# Patient Record
Sex: Female | Born: 1980 | Hispanic: No | Marital: Single | Smoking: Never smoker
Health system: Southern US, Community
[De-identification: ages and names within clinical notes are randomized; demographics above are authoritative.]

## PROBLEM LIST (undated history)

## (undated) DIAGNOSIS — Z9889 Other specified postprocedural states: Secondary | ICD-10-CM

## (undated) DIAGNOSIS — D689 Coagulation defect, unspecified: Secondary | ICD-10-CM

## (undated) DIAGNOSIS — D219 Benign neoplasm of connective and other soft tissue, unspecified: Secondary | ICD-10-CM

## (undated) DIAGNOSIS — F419 Anxiety disorder, unspecified: Secondary | ICD-10-CM

## (undated) DIAGNOSIS — K5090 Crohn's disease, unspecified, without complications: Secondary | ICD-10-CM

## (undated) DIAGNOSIS — R112 Nausea with vomiting, unspecified: Secondary | ICD-10-CM

## (undated) DIAGNOSIS — E282 Polycystic ovarian syndrome: Secondary | ICD-10-CM

## (undated) DIAGNOSIS — I2699 Other pulmonary embolism without acute cor pulmonale: Secondary | ICD-10-CM

## (undated) DIAGNOSIS — K5010 Crohn's disease of large intestine without complications: Secondary | ICD-10-CM

## (undated) HISTORY — DX: Crohn's disease of large intestine without complications: K50.10

## (undated) HISTORY — DX: Benign neoplasm of connective and other soft tissue, unspecified: D21.9

## (undated) HISTORY — DX: Coagulation defect, unspecified: D68.9

---
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
## 2007-11-10 HISTORY — PX: NASAL SEPTUM SURGERY: SHX37

## 2010-05-09 HISTORY — PX: COLONOSCOPY W/ BIOPSIES: SHX1374

## 2010-05-26 ENCOUNTER — Encounter: Admission: RE | Admit: 2010-05-26 | Discharge: 2010-05-26 | Payer: Self-pay | Admitting: Gastroenterology

## 2010-07-07 ENCOUNTER — Encounter: Payer: Self-pay | Admitting: Internal Medicine

## 2010-12-09 NOTE — Miscellaneous (Signed)
Summary: GI DR  Patient was scheduled to see Dr Juanda Chance, however, since scheduling with Dr Juanda Chance, she saw Dr Loreta Ave. I have left a message for patient that I will be cancelling her appointment with Dr Juanda Chance as Dr Kenna Gilbert office has confirmed that patient hasi been seeing them. Dottie Nelson-Smith CMA Duncan Dull)  July 07, 2010 4:23 PM    Clinical Lists Changes

## 2011-12-01 ENCOUNTER — Other Ambulatory Visit: Payer: Self-pay | Admitting: Gastroenterology

## 2011-12-01 DIAGNOSIS — R1013 Epigastric pain: Secondary | ICD-10-CM

## 2011-12-01 DIAGNOSIS — R110 Nausea: Secondary | ICD-10-CM

## 2011-12-13 ENCOUNTER — Telehealth: Payer: Self-pay | Admitting: Gastroenterology

## 2011-12-13 ENCOUNTER — Emergency Department (HOSPITAL_COMMUNITY): Payer: PRIVATE HEALTH INSURANCE

## 2011-12-13 ENCOUNTER — Encounter (HOSPITAL_COMMUNITY): Payer: Self-pay | Admitting: *Deleted

## 2011-12-13 ENCOUNTER — Emergency Department (HOSPITAL_COMMUNITY)
Admission: EM | Admit: 2011-12-13 | Discharge: 2011-12-13 | Disposition: A | Discharge status: Home / Self Care | Payer: PRIVATE HEALTH INSURANCE | Attending: Emergency Medicine | Admitting: Emergency Medicine

## 2011-12-13 DIAGNOSIS — R197 Diarrhea, unspecified: Secondary | ICD-10-CM | POA: Insufficient documentation

## 2011-12-13 DIAGNOSIS — R109 Unspecified abdominal pain: Secondary | ICD-10-CM

## 2011-12-13 DIAGNOSIS — R10816 Epigastric abdominal tenderness: Secondary | ICD-10-CM | POA: Insufficient documentation

## 2011-12-13 HISTORY — DX: Polycystic ovarian syndrome: E28.2

## 2011-12-13 HISTORY — DX: Crohn's disease, unspecified, without complications: K50.90

## 2011-12-13 LAB — COMPREHENSIVE METABOLIC PANEL
AST: 16 U/L (ref 0–37)
Albumin: 3.7 g/dL (ref 3.5–5.2)
BUN: 10 mg/dL (ref 6–23)
Calcium: 9 mg/dL (ref 8.4–10.5)
Creatinine, Ser: 0.72 mg/dL (ref 0.50–1.10)
GFR calc Af Amer: >90 mL/min (ref >90)
GFR calc non Af Amer: >90 mL/min (ref >90)
Total Protein: 6.8 g/dL (ref 6.0–8.3)

## 2011-12-13 LAB — URINALYSIS, ROUTINE W REFLEX MICROSCOPIC
Glucose, UA: NEGATIVE mg/dL
Hgb urine dipstick: NEGATIVE
Protein, ur: NEGATIVE mg/dL
pH: 7.5 (ref 5.0–8.0)

## 2011-12-13 LAB — CBC
MCHC: 33.3 g/dL (ref 30.0–36.0)
MCV: 93.3 fL (ref 78.0–100.0)
Platelets: 232 10*3/uL (ref 150–400)
RBC: 4.35 MIL/uL (ref 3.87–5.11)
WBC: 10 10*3/uL (ref 4.0–10.5)

## 2011-12-13 LAB — PREGNANCY, URINE: Preg Test, Ur: NEGATIVE

## 2011-12-13 MED ORDER — HYDROMORPHONE HCL PF 1 MG/ML IJ SOLN
1.0000 mg | Freq: Once | INTRAMUSCULAR | Status: DC
  Filled 2011-12-13: qty 1

## 2011-12-13 MED ORDER — OMEPRAZOLE 20 MG PO CPDR: 20.0000 mg | DELAYED_RELEASE_CAPSULE | Freq: Every day | ORAL | Status: DC

## 2011-12-13 MED ORDER — PANTOPRAZOLE SODIUM 40 MG IV SOLR
40.0000 mg | Freq: Once | INTRAVENOUS | Status: AC
  Administered 2011-12-13: 40 mg via INTRAVENOUS
  Filled 2011-12-13: qty 40

## 2011-12-13 MED ORDER — ONDANSETRON HCL 4 MG/2ML IJ SOLN
4.0000 mg | Freq: Once | INTRAMUSCULAR | Status: AC
  Administered 2011-12-13: 4 mg via INTRAVENOUS
  Filled 2011-12-13: qty 2

## 2011-12-13 NOTE — ED Notes (Signed)
Pt from home c/o epigastric pain that radiates all over abdomen off and on for about 2 months but has worsened since Friday. Pt also endorses nausea and diarrhea but no vomiting or fever, pt reports hx of Chron's disease.

## 2011-12-13 NOTE — ED Provider Notes (Signed)
4:28 PM  Results for orders placed during the hospital encounter of 12/13/11  URINALYSIS, ROUTINE W REFLEX MICROSCOPIC      Component Value Range   Color, Urine YELLOW  YELLOW    APPearance CLEAR  CLEAR    Specific Gravity, Urine 1.021  1.005 - 1.030    pH 7.5  5.0 - 8.0    Glucose, UA NEGATIVE  NEGATIVE (mg/dL)   Hgb urine dipstick NEGATIVE  NEGATIVE    Bilirubin Urine NEGATIVE  NEGATIVE    Ketones, ur NEGATIVE  NEGATIVE (mg/dL)   Protein, ur NEGATIVE  NEGATIVE (mg/dL)   Urobilinogen, UA 0.2  0.0 - 1.0 (mg/dL)   Nitrite NEGATIVE  NEGATIVE    Leukocytes, UA NEGATIVE  NEGATIVE   CBC      Component Value Range   WBC 10.0  4.0 - 10.5 (K/uL)   RBC 4.35  3.87 - 5.11 (MIL/uL)   Hemoglobin 13.5  12.0 - 15.0 (g/dL)   HCT 16.1  09.6 - 04.5 (%)   MCV 93.3  78.0 - 100.0 (fL)   MCH 31.0  26.0 - 34.0 (pg)   MCHC 33.3  30.0 - 36.0 (g/dL)   RDW 40.9  81.1 - 91.4 (%)   Platelets 232  150 - 400 (K/uL)  LIPASE, BLOOD      Component Value Range   Lipase 36  11 - 59 (U/L)  COMPREHENSIVE METABOLIC PANEL      Component Value Range   Sodium 138  135 - 145 (mEq/L)   Potassium 3.9  3.5 - 5.1 (mEq/L)   Chloride 103  96 - 112 (mEq/L)   CO2 26  19 - 32 (mEq/L)   Glucose, Bld 89  70 - 99 (mg/dL)   BUN 10  6 - 23 (mg/dL)   Creatinine, Ser 7.82  0.50 - 1.10 (mg/dL)   Calcium 9.0  8.4 - 95.6 (mg/dL)   Total Protein 6.8  6.0 - 8.3 (g/dL)   Albumin 3.7  3.5 - 5.2 (g/dL)   AST 16  0 - 37 (U/L)   ALT 13  0 - 35 (U/L)   Alkaline Phosphatase 57  39 - 117 (U/L)   Total Bilirubin 0.2 (*) 0.3 - 1.2 (mg/dL)   GFR calc non Af Amer >90  >90 (mL/min)   GFR calc Af Amer >90  >90 (mL/min)  PREGNANCY, URINE      Component Value Range   Preg Test, Ur NEGATIVE  NEGATIVE    US Abdomen Complete  12/13/2011  *RADIOLOGY REPORT*  Clinical Data:  Abdominal pain.  Nausea.  Crohn's disease and polycystic ovarian disease.  COMPLETE ABDOMINAL ULTRASOUND  Comparison:  05/26/2010 abdominal CT.  Findings:  Gallbladder:  No  gallstones, gallbladder wall thickening, or pericholecystic fluid.  Common bile duct:  3 mm, normal.  Liver:  No focal lesion identified.  Within normal limits in parenchymal echogenicity.  IVC:  Appears normal.  Pancreas:  No focal abnormality seen.  Spleen:  62 mm.  Normal echotexture.  Right Kidney:  10.8 cm. Normal echotexture.  Normal central sinus echo complex.  No calculi or hydronephrosis.  Left Kidney:  10.7 cm. Normal echotexture.  Normal central sinus echo complex.  No calculi or hydronephrosis.  Abdominal aorta:  17 mm.  No aneurysm.  IMPRESSION: Normal abdominal ultrasound.  Original Report Authenticated By: Andreas Newport, M.D.    Pt given results of Ultrasound.  Normal.  Rx for prilosec.  Can f/u with Dr Loreta Ave as outpt.  D/c home in  stable and improved condition.  Travius Crochet A. Patrica Duel, MD 12/13/11 1610

## 2011-12-13 NOTE — ED Provider Notes (Signed)
History     CSN: [id number]  Arrival date & time 12/13/11  1158   First MD Initiated Contact with Patient 12/13/11 1356      Chief Complaint  Patient presents with  . Abdominal Pain    Generalized  . Diarrhea    (Consider location/radiation/quality/duration/timing/severity/associated sxs/prior treatment) Patient is a 31 y.o. female presenting with abdominal pain and diarrhea. The history is provided by the patient.  Abdominal Pain The primary symptoms of the illness include abdominal pain and diarrhea. The primary symptoms of the illness do not include fever or shortness of breath.  Symptoms associated with the illness do not include chills or back pain.  Diarrhea The primary symptoms include abdominal pain and diarrhea. Primary symptoms do not include fever or rash.  The illness does not include chills or back pain.  pt c/o epigastric pain that occasionally radiates to bil upper quadrants on and off for past couple months. Worse w eating. Dull, no radiation to back or flank. No mid to lower abd pain. Normal appetite. No nv. Has had couple loose stools. States is completely unlike her previous crohns disease related symptoms which has consisted of lower abd cramping pain w diarrheal stools. No vaginal bleeding or discharge. No gu c/o. No prior abd surgery. No recent abx use. No fever or chills.   Past Medical History  Diagnosis Date  . Asthma   . Crohn's disease   . PCOS (polycystic ovarian syndrome)     Past Surgical History  Procedure Date  . Colonoscopy w/ biopsies   . Deviated septum repair   . Wisdom teeth removal   . Tympanoplasty     History reviewed. No pertinent family history.  History  Substance Use Topics  . Smoking status: Never Smoker   . Smokeless tobacco: Never Used  . Alcohol Use: Yes     occ    OB History    Grav Para Term Preterm Abortions TAB SAB Ect Mult Living                  Review of Systems  Constitutional: Negative for fever and  chills.  HENT: Negative for sore throat.   Eyes: Negative for redness.  Respiratory: Negative for cough and shortness of breath.   Cardiovascular: Negative for chest pain.  Gastrointestinal: Positive for abdominal pain and diarrhea.  Genitourinary: Negative for flank pain.  Musculoskeletal: Negative for back pain.  Skin: Negative for rash.  Neurological: Negative for headaches.  Psychiatric/Behavioral: Negative for confusion.    Allergies  Other; Penicillins; and Sulfa antibiotics  Home Medications   Current Outpatient Rx  Name Route Sig Dispense Refill  . ALBUTEROL SULFATE HFA 108 (90 BASE) MCG/ACT IN AERS Inhalation Inhale 2 puffs into the lungs every 4 (four) hours as needed. For shortness of breath.    . AZATHIOPRINE 50 MG PO TABS Oral Take 50 mg by mouth at bedtime.    Marland Kitchen CETIRIZINE HCL 10 MG PO TABS Oral Take 10 mg by mouth daily as needed. For allergies.    Marland Kitchen ESCITALOPRAM OXALATE 20 MG PO TABS Oral Take 20 mg by mouth at bedtime.    Marland Kitchen MESALAMINE 1.2 G PO TBEC Oral Take 2,400 mg by mouth 2 (two) times daily.    Azzie Roup ACE-ETH ESTRAD-FE 1-20 MG-MCG PO TABS Oral Take 1 tablet by mouth daily.      BP 128/68  Pulse 84  Temp(Src) 98.2 F (36.8 C) (Oral)  Resp 16  Ht 5' 3.5" (1.613 m)  Wt 138 lb (62.596 kg)  BMI 24.06 kg/m2  SpO2 99%  LMP 11/12/2011  Physical Exam  Nursing note and vitals reviewed. Constitutional: She appears well-developed and well-nourished. No distress.  Eyes: Conjunctivae are normal. No scleral icterus.  Neck: Neck supple. No tracheal deviation present.  Cardiovascular: Normal rate, regular rhythm, normal heart sounds and intact distal pulses.   Pulmonary/Chest: Effort normal and breath sounds normal. No respiratory distress.  Abdominal: Soft. Normal appearance and bowel sounds are normal. She exhibits no distension and no mass. There is tenderness. There is no rebound and no guarding.       Mild epigastric tenderness no rebound or guarding.     Genitourinary:       No cva tenderness  Musculoskeletal: She exhibits no edema and no tenderness.  Neurological: She is alert.  Skin: Skin is warm and dry. No rash noted.  Psychiatric: She has a normal mood and affect.    ED Course  Procedures (including critical care time)   Labs Reviewed  URINALYSIS, ROUTINE W REFLEX MICROSCOPIC    Results for orders placed during the hospital encounter of 12/13/11  URINALYSIS, ROUTINE W REFLEX MICROSCOPIC      Component Value Range   Color, Urine YELLOW  YELLOW    APPearance CLEAR  CLEAR    Specific Gravity, Urine 1.021  1.005 - 1.030    pH 7.5  5.0 - 8.0    Glucose, UA NEGATIVE  NEGATIVE (mg/dL)   Hgb urine dipstick NEGATIVE  NEGATIVE    Bilirubin Urine NEGATIVE  NEGATIVE    Ketones, ur NEGATIVE  NEGATIVE (mg/dL)   Protein, ur NEGATIVE  NEGATIVE (mg/dL)   Urobilinogen, UA 0.2  0.0 - 1.0 (mg/dL)   Nitrite NEGATIVE  NEGATIVE    Leukocytes, UA NEGATIVE  NEGATIVE   CBC      Component Value Range   WBC 10.0  4.0 - 10.5 (K/uL)   RBC 4.35  3.87 - 5.11 (MIL/uL)   Hemoglobin 13.5  12.0 - 15.0 (g/dL)   HCT 62.1  30.8 - 65.7 (%)   MCV 93.3  78.0 - 100.0 (fL)   MCH 31.0  26.0 - 34.0 (pg)   MCHC 33.3  30.0 - 36.0 (g/dL)   RDW 84.6  96.2 - 95.2 (%)   Platelets 232  150 - 400 (K/uL)  LIPASE, BLOOD      Component Value Range   Lipase 36  11 - 59 (U/L)  COMPREHENSIVE METABOLIC PANEL      Component Value Range   Sodium 138  135 - 145 (mEq/L)   Potassium 3.9  3.5 - 5.1 (mEq/L)   Chloride 103  96 - 112 (mEq/L)   CO2 26  19 - 32 (mEq/L)   Glucose, Bld 89  70 - 99 (mg/dL)   BUN 10  6 - 23 (mg/dL)   Creatinine, Ser 8.41  0.50 - 1.10 (mg/dL)   Calcium 9.0  8.4 - 32.4 (mg/dL)   Total Protein 6.8  6.0 - 8.3 (g/dL)   Albumin 3.7  3.5 - 5.2 (g/dL)   AST 16  0 - 37 (U/L)   ALT 13  0 - 35 (U/L)   Alkaline Phosphatase 57  39 - 117 (U/L)   Total Bilirubin 0.2 (*) 0.3 - 1.2 (mg/dL)   GFR calc non Af Amer >90  >90 (mL/min)   GFR calc Af Amer >90   >90 (mL/min)  PREGNANCY, URINE      Component Value Range   Preg Test,  Ur NEGATIVE  NEGATIVE      MDM  Labs. U/s.  Iv ns, dilaudid 1 mg iv. zofran iv.   Pt comfortable, awaiting u/s result.   U/s still pending. Signed out to Dr Patrica Duel - check u/s when back, recheck pt and dispo appropriately.   Suzi Roots, MD 12/13/11 [phone]

## 2011-12-13 NOTE — ED Notes (Signed)
Patient given discharge instructions, information, prescriptions, and diet order. Patient states that they adequately understand discharge information given and to return to ED if symptoms return or worsen.     

## 2011-12-13 NOTE — Telephone Encounter (Signed)
Pt of Dr.Mann's.  Has been having intermittent epigastric pain, scheduled for out patient [place] and [facility] this week.  Yesterday, abd pain, bloating, diarrhea, and "yellowing around the eyes."  Just started imuran for crohn's about a week ago.  Could be med related, could be biliary, perhaps gastroenteritits. I instructed her to go to ER for evaluation, imaging and labs.  I will pass word on to Dr. Loreta Ave as well.

## 2011-12-16 ENCOUNTER — Encounter (HOSPITAL_COMMUNITY)
Admission: RE | Admit: 2011-12-16 | Discharge: 2011-12-16 | Disposition: A | Discharge status: Home / Self Care | Payer: PRIVATE HEALTH INSURANCE | Source: Ambulatory Visit | Attending: Gastroenterology | Admitting: Gastroenterology

## 2011-12-16 ENCOUNTER — Encounter (HOSPITAL_COMMUNITY): Payer: Self-pay

## 2011-12-16 ENCOUNTER — Other Ambulatory Visit (HOSPITAL_COMMUNITY): Payer: Self-pay

## 2011-12-16 DIAGNOSIS — R110 Nausea: Secondary | ICD-10-CM | POA: Insufficient documentation

## 2011-12-16 DIAGNOSIS — R1013 Epigastric pain: Secondary | ICD-10-CM | POA: Insufficient documentation

## 2011-12-16 MED ORDER — TECHNETIUM TC 99M MEBROFENIN IV KIT
5.3000 | PACK | Freq: Once | INTRAVENOUS | Status: AC | PRN
  Administered 2011-12-16: 5.3 via INTRAVENOUS

## 2012-04-16 ENCOUNTER — Telehealth: Payer: Self-pay | Admitting: Internal Medicine

## 2012-04-16 NOTE — Telephone Encounter (Signed)
Patient called after starting budesonide yesterday, increase AZA from 50 to 100 mg for Crohn's disease. Also using analpram for anal fissure. Rash from head to toe, small dots which are purplish red.  No itching.  No pain.  No fever. No cp, dyspnea, no tongue swelling. No other new meds, on chronic lexapro, birth control and zyrtec.  I have recommended stopping budesonide and returning aza to 50 mg daily. She is advised to come in if rash worsens, starts to hurt or if she develops difficulty breathing, throat or tongue swelling, cp.  She voices understanding. Can use benadryl 25 mg po q6hprn She will contact Dr. Loreta Ave on Monday.

## 2012-06-01 ENCOUNTER — Inpatient Hospital Stay (HOSPITAL_COMMUNITY)
Admission: EM | Admit: 2012-06-01 | Discharge: 2012-06-03 | DRG: 342 | Disposition: A | Discharge status: Home / Self Care | Payer: PRIVATE HEALTH INSURANCE | Attending: General Surgery | Admitting: General Surgery

## 2012-06-01 ENCOUNTER — Inpatient Hospital Stay (HOSPITAL_COMMUNITY): Payer: PRIVATE HEALTH INSURANCE | Admitting: Anesthesiology

## 2012-06-01 ENCOUNTER — Other Ambulatory Visit: Payer: Self-pay | Admitting: Gastroenterology

## 2012-06-01 ENCOUNTER — Encounter (HOSPITAL_COMMUNITY): Admission: EM | Disposition: A | Discharge status: Home / Self Care | Payer: Self-pay | Source: Home / Self Care

## 2012-06-01 ENCOUNTER — Ambulatory Visit
Admission: RE | Admit: 2012-06-01 | Discharge: 2012-06-01 | Disposition: A | Discharge status: Home / Self Care | Payer: PRIVATE HEALTH INSURANCE | Source: Ambulatory Visit | Attending: Gastroenterology | Admitting: Gastroenterology

## 2012-06-01 ENCOUNTER — Encounter (HOSPITAL_COMMUNITY): Payer: Self-pay | Admitting: Emergency Medicine

## 2012-06-01 ENCOUNTER — Encounter (HOSPITAL_COMMUNITY): Payer: Self-pay | Admitting: Anesthesiology

## 2012-06-01 DIAGNOSIS — K5090 Crohn's disease, unspecified, without complications: Secondary | ICD-10-CM | POA: Diagnosis present

## 2012-06-01 DIAGNOSIS — M25519 Pain in unspecified shoulder: Secondary | ICD-10-CM | POA: Diagnosis present

## 2012-06-01 DIAGNOSIS — R109 Unspecified abdominal pain: Secondary | ICD-10-CM

## 2012-06-01 DIAGNOSIS — E282 Polycystic ovarian syndrome: Secondary | ICD-10-CM | POA: Diagnosis present

## 2012-06-01 DIAGNOSIS — K3580 Unspecified acute appendicitis: Secondary | ICD-10-CM

## 2012-06-01 DIAGNOSIS — D3A020 Benign carcinoid tumor of the appendix: Secondary | ICD-10-CM

## 2012-06-01 DIAGNOSIS — J45909 Unspecified asthma, uncomplicated: Secondary | ICD-10-CM | POA: Diagnosis present

## 2012-06-01 DIAGNOSIS — Z79899 Other long term (current) drug therapy: Secondary | ICD-10-CM

## 2012-06-01 HISTORY — DX: Other specified postprocedural states: Z98.890

## 2012-06-01 HISTORY — PX: APPENDECTOMY: SHX54

## 2012-06-01 HISTORY — PX: LAPAROSCOPIC APPENDECTOMY: SHX408

## 2012-06-01 HISTORY — DX: Nausea with vomiting, unspecified: R11.2

## 2012-06-01 LAB — CBC WITH DIFFERENTIAL/PLATELET
Basophils Relative: 0 % (ref 0–1)
Eosinophils Absolute: 0 10*3/uL (ref 0.0–0.7)
HCT: 41.3 % (ref 36.0–46.0)
Lymphocytes Relative: 5 % — ABNORMAL LOW (ref 12–46)
Lymphs Abs: 0.8 10*3/uL (ref 0.7–4.0)
MCH: 32.2 pg (ref 26.0–34.0)
MCHC: 34.1 g/dL (ref 30.0–36.0)
MCV: 94.3 fL (ref 78.0–100.0)
Neutrophils Relative %: 92 % — ABNORMAL HIGH (ref 43–77)
RBC: 4.38 MIL/uL (ref 3.87–5.11)

## 2012-06-01 LAB — COMPREHENSIVE METABOLIC PANEL
Creatinine, Ser: 0.66 mg/dL (ref 0.50–1.10)
GFR calc Af Amer: >90 mL/min (ref >90)
GFR calc non Af Amer: >90 mL/min (ref >90)
Glucose, Bld: 117 mg/dL — ABNORMAL HIGH (ref 70–99)
Potassium: 3.4 mEq/L — ABNORMAL LOW (ref 3.5–5.1)
Sodium: 136 mEq/L (ref 135–145)
Total Protein: 7.4 g/dL (ref 6.0–8.3)

## 2012-06-01 LAB — URINALYSIS, ROUTINE W REFLEX MICROSCOPIC
Ketones, ur: 40 mg/dL — AB
Leukocytes, UA: NEGATIVE
Nitrite: NEGATIVE
Protein, ur: NEGATIVE mg/dL
Urobilinogen, UA: 0.2 mg/dL (ref 0.0–1.0)

## 2012-06-01 LAB — POCT PREGNANCY, URINE: Preg Test, Ur: NEGATIVE

## 2012-06-01 LAB — URINE MICROSCOPIC-ADD ON

## 2012-06-01 SURGERY — APPENDECTOMY, LAPAROSCOPIC
Anesthesia: General | Site: Abdomen | Wound class: Contaminated

## 2012-06-01 MED ORDER — LORATADINE 10 MG PO TABS
10.0000 mg | ORAL_TABLET | Freq: Every day | ORAL | Status: DC
  Filled 2012-06-01 (×2): qty 1

## 2012-06-01 MED ORDER — FENTANYL CITRATE 0.05 MG/ML IJ SOLN
INTRAMUSCULAR | Status: DC | PRN
  Administered 2012-06-01: 50 ug via INTRAVENOUS
  Administered 2012-06-01 (×2): 100 ug via INTRAVENOUS

## 2012-06-01 MED ORDER — PROMETHAZINE HCL 25 MG/ML IJ SOLN
6.2500 mg | INTRAMUSCULAR | Status: DC | PRN
  Administered 2012-06-01: 12.5 mg via INTRAVENOUS

## 2012-06-01 MED ORDER — SODIUM CHLORIDE 0.9 % IV SOLN
1000.0000 mL | INTRAVENOUS | Status: DC
  Administered 2012-06-01: 1000 mL via INTRAVENOUS

## 2012-06-01 MED ORDER — SCOPOLAMINE 1 MG/3DAYS TD PT72
MEDICATED_PATCH | TRANSDERMAL | Status: DC | PRN
  Administered 2012-06-01: 1.5 mg via TRANSDERMAL

## 2012-06-01 MED ORDER — METRONIDAZOLE IN NACL 5-0.79 MG/ML-% IV SOLN
500.0000 mg | Freq: Three times a day (TID) | INTRAVENOUS | Status: DC
  Administered 2012-06-01: 500 mg via INTRAVENOUS
  Filled 2012-06-01 (×3): qty 100

## 2012-06-01 MED ORDER — PROMETHAZINE HCL 25 MG/ML IJ SOLN: 12.5000 mg | INTRAMUSCULAR | Status: DC | PRN

## 2012-06-01 MED ORDER — DEXAMETHASONE SODIUM PHOSPHATE 10 MG/ML IJ SOLN
INTRAMUSCULAR | Status: DC | PRN
  Administered 2012-06-01: 10 mg via INTRAVENOUS

## 2012-06-01 MED ORDER — ALBUTEROL SULFATE HFA 108 (90 BASE) MCG/ACT IN AERS
2.0000 | INHALATION_SPRAY | RESPIRATORY_TRACT | Status: DC | PRN
  Filled 2012-06-01: qty 6.7

## 2012-06-01 MED ORDER — LACTATED RINGERS IV SOLN: INTRAVENOUS | Status: DC

## 2012-06-01 MED ORDER — MORPHINE SULFATE 2 MG/ML IJ SOLN
1.0000 mg | INTRAMUSCULAR | Status: DC | PRN
  Administered 2012-06-01: 1 mg via INTRAVENOUS
  Filled 2012-06-01: qty 1

## 2012-06-01 MED ORDER — BUPIVACAINE HCL (PF) 0.25 % IJ SOLN
INTRAMUSCULAR | Status: AC
  Filled 2012-06-01: qty 30

## 2012-06-01 MED ORDER — AZATHIOPRINE 50 MG PO TABS
100.0000 mg | ORAL_TABLET | Freq: Every day | ORAL | Status: DC
  Administered 2012-06-02: 100 mg via ORAL
  Filled 2012-06-01 (×3): qty 2

## 2012-06-01 MED ORDER — CIPROFLOXACIN IN D5W 400 MG/200ML IV SOLN
400.0000 mg | Freq: Two times a day (BID) | INTRAVENOUS | Status: DC
  Administered 2012-06-01: 400 mg via INTRAVENOUS
  Filled 2012-06-01 (×2): qty 200

## 2012-06-01 MED ORDER — ACETAMINOPHEN 325 MG PO TABS: 650.0000 mg | ORAL_TABLET | Freq: Four times a day (QID) | ORAL | Status: DC | PRN

## 2012-06-01 MED ORDER — ONDANSETRON HCL 4 MG/2ML IJ SOLN
4.0000 mg | Freq: Once | INTRAMUSCULAR | Status: AC
  Administered 2012-06-01: 4 mg via INTRAVENOUS
  Filled 2012-06-01: qty 2

## 2012-06-01 MED ORDER — METRONIDAZOLE IN NACL 5-0.79 MG/ML-% IV SOLN
500.0000 mg | Freq: Three times a day (TID) | INTRAVENOUS | Status: DC
  Administered 2012-06-02 – 2012-06-03 (×5): 500 mg via INTRAVENOUS
  Filled 2012-06-01 (×5): qty 100

## 2012-06-01 MED ORDER — IOHEXOL 300 MG/ML  SOLN
30.0000 mL | Freq: Once | INTRAMUSCULAR | Status: AC | PRN
  Administered 2012-06-01: 30 mL via ORAL

## 2012-06-01 MED ORDER — ETOMIDATE 2 MG/ML IV SOLN
INTRAVENOUS | Status: DC | PRN
  Administered 2012-06-01: 16 mg via INTRAVENOUS

## 2012-06-01 MED ORDER — ACETAMINOPHEN 650 MG RE SUPP: 650.0000 mg | Freq: Four times a day (QID) | RECTAL | Status: DC | PRN

## 2012-06-01 MED ORDER — ONDANSETRON HCL 4 MG/2ML IJ SOLN
INTRAMUSCULAR | Status: DC | PRN
  Administered 2012-06-01: 4 mg via INTRAVENOUS

## 2012-06-01 MED ORDER — OXYCODONE HCL 5 MG PO TABS
5.0000 mg | ORAL_TABLET | ORAL | Status: DC | PRN
  Administered 2012-06-02 – 2012-06-03 (×4): 5 mg via ORAL
  Filled 2012-06-01 (×4): qty 1

## 2012-06-01 MED ORDER — SODIUM CHLORIDE 0.9 % IV SOLN
INTRAVENOUS | Status: DC
  Administered 2012-06-01 – 2012-06-03 (×3): via INTRAVENOUS

## 2012-06-01 MED ORDER — PROMETHAZINE HCL 25 MG/ML IJ SOLN
INTRAMUSCULAR | Status: AC
  Filled 2012-06-01: qty 1

## 2012-06-01 MED ORDER — LIDOCAINE HCL (CARDIAC) 20 MG/ML IV SOLN
INTRAVENOUS | Status: DC | PRN
  Administered 2012-06-01: 50 mg via INTRAVENOUS

## 2012-06-01 MED ORDER — SUCCINYLCHOLINE CHLORIDE 20 MG/ML IJ SOLN
INTRAMUSCULAR | Status: DC | PRN
  Administered 2012-06-01: 100 mg via INTRAVENOUS

## 2012-06-01 MED ORDER — ROCURONIUM BROMIDE 100 MG/10ML IV SOLN
INTRAVENOUS | Status: DC | PRN
  Administered 2012-06-01: 30 mg via INTRAVENOUS

## 2012-06-01 MED ORDER — LACTATED RINGERS IV SOLN
INTRAVENOUS | Status: DC | PRN
  Administered 2012-06-01: 300 mL via INTRAVENOUS

## 2012-06-01 MED ORDER — ONDANSETRON HCL 4 MG/2ML IJ SOLN: 4.0000 mg | Freq: Four times a day (QID) | INTRAMUSCULAR | Status: DC | PRN

## 2012-06-01 MED ORDER — MORPHINE SULFATE 2 MG/ML IJ SOLN: 2.0000 mg | INTRAMUSCULAR | Status: DC | PRN

## 2012-06-01 MED ORDER — POTASSIUM CHLORIDE IN NACL 40-0.9 MEQ/L-% IV SOLN
INTRAVENOUS | Status: DC
  Administered 2012-06-01: 18:00:00 via INTRAVENOUS
  Filled 2012-06-01 (×2): qty 1000

## 2012-06-01 MED ORDER — MIDAZOLAM HCL 5 MG/5ML IJ SOLN
INTRAMUSCULAR | Status: DC | PRN
  Administered 2012-06-01: 2 mg via INTRAVENOUS

## 2012-06-01 MED ORDER — FENTANYL CITRATE 0.05 MG/ML IJ SOLN: 25.0000 ug | INTRAMUSCULAR | Status: DC | PRN

## 2012-06-01 MED ORDER — IOHEXOL 300 MG/ML  SOLN
100.0000 mL | Freq: Once | INTRAMUSCULAR | Status: AC | PRN
  Administered 2012-06-01: 100 mL via INTRAVENOUS

## 2012-06-01 MED ORDER — ESCITALOPRAM OXALATE 20 MG PO TABS
20.0000 mg | ORAL_TABLET | Freq: Every day | ORAL | Status: DC
  Administered 2012-06-02: 20 mg via ORAL
  Filled 2012-06-01 (×3): qty 1

## 2012-06-01 MED ORDER — LACTATED RINGERS IV SOLN
INTRAVENOUS | Status: DC | PRN
  Administered 2012-06-01 (×2): via INTRAVENOUS

## 2012-06-01 MED ORDER — CIPROFLOXACIN IN D5W 400 MG/200ML IV SOLN
400.0000 mg | Freq: Two times a day (BID) | INTRAVENOUS | Status: DC
  Administered 2012-06-02 – 2012-06-03 (×3): 400 mg via INTRAVENOUS
  Filled 2012-06-01 (×3): qty 200

## 2012-06-01 MED ORDER — BUPIVACAINE HCL 0.25 % IJ SOLN
INTRAMUSCULAR | Status: DC | PRN
  Administered 2012-06-01: 15 mL

## 2012-06-01 MED ORDER — SCOPOLAMINE 1 MG/3DAYS TD PT72
MEDICATED_PATCH | TRANSDERMAL | Status: AC
  Filled 2012-06-01: qty 1

## 2012-06-01 SURGICAL SUPPLY — 38 items
CABLE HIGH FREQUENCY MONO STRZ (ELECTRODE) ×2 IMPLANT
CANISTER SUCTION 2500CC (MISCELLANEOUS) ×2 IMPLANT
CHLORAPREP W/TINT 26ML (MISCELLANEOUS) ×2 IMPLANT
CLOTH BEACON ORANGE TIMEOUT ST (SAFETY) ×2 IMPLANT
COVER SURGICAL LIGHT HANDLE (MISCELLANEOUS) ×2 IMPLANT
CUTTER FLEX LINEAR 45M (STAPLE) ×2 IMPLANT
DECANTER SPIKE VIAL GLASS SM (MISCELLANEOUS) ×2 IMPLANT
DERMABOND ADVANCED (GAUZE/BANDAGES/DRESSINGS) ×1
DERMABOND ADVANCED .7 DNX12 (GAUZE/BANDAGES/DRESSINGS) ×1 IMPLANT
DRAPE LAPAROSCOPIC ABDOMINAL (DRAPES) ×2 IMPLANT
ELECT REM PT RETURN 9FT ADLT (ELECTROSURGICAL) ×2
ELECTRODE REM PT RTRN 9FT ADLT (ELECTROSURGICAL) ×1 IMPLANT
GLOVE BIO SURGEON STRL SZ7 (GLOVE) ×4 IMPLANT
GLOVE BIOGEL PI IND STRL 7.0 (GLOVE) ×1 IMPLANT
GLOVE BIOGEL PI IND STRL 7.5 (GLOVE) ×2 IMPLANT
GLOVE BIOGEL PI INDICATOR 7.0 (GLOVE) ×1
GLOVE BIOGEL PI INDICATOR 7.5 (GLOVE) ×2
GOWN PREVENTION PLUS LG XLONG (DISPOSABLE) ×2 IMPLANT
GOWN PREVENTION PLUS XLARGE (GOWN DISPOSABLE) ×2 IMPLANT
GOWN STRL NON-REIN LRG LVL3 (GOWN DISPOSABLE) ×2 IMPLANT
GOWN STRL REIN XL XLG (GOWN DISPOSABLE) ×2 IMPLANT
KIT BASIN OR (CUSTOM PROCEDURE TRAY) ×2 IMPLANT
NS IRRIG 1000ML POUR BTL (IV SOLUTION) ×2 IMPLANT
PENCIL BUTTON HOLSTER BLD 10FT (ELECTRODE) IMPLANT
POUCH SPECIMEN RETRIEVAL 10MM (ENDOMECHANICALS) ×2 IMPLANT
RELOAD 45 VASCULAR/THIN (ENDOMECHANICALS) IMPLANT
RELOAD STAPLE TA45 3.5 REG BLU (ENDOMECHANICALS) ×2 IMPLANT
SCALPEL HARMONIC ACE (MISCELLANEOUS) ×2 IMPLANT
SET IRRIG TUBING LAPAROSCOPIC (IRRIGATION / IRRIGATOR) ×2 IMPLANT
SOLUTION ANTI FOG 6CC (MISCELLANEOUS) ×2 IMPLANT
SUT MNCRL AB 4-0 PS2 18 (SUTURE) ×2 IMPLANT
SUT VICRYL 0 ENDOLOOP (SUTURE) IMPLANT
TOWEL OR 17X26 10 PK STRL BLUE (TOWEL DISPOSABLE) ×2 IMPLANT
TRAY FOLEY CATH 14FRSI W/METER (CATHETERS) ×2 IMPLANT
TRAY LAP CHOLE (CUSTOM PROCEDURE TRAY) ×2 IMPLANT
TROCAR BLADELESS OPT 5 75 (ENDOMECHANICALS) ×4 IMPLANT
TROCAR XCEL BLUNT TIP 100MML (ENDOMECHANICALS) ×2 IMPLANT
TUBING INSUFFLATION 10FT LAP (TUBING) ×2 IMPLANT

## 2012-06-01 NOTE — Consult Note (Deleted)
Reason for Consult: Appendicits  Referring Physician: Jeraldine Hart GI: Caitlin Hart is an 31 y.o. female.  HPI: Patient is a 31 year old female with history of Crohn's disease. Her last flare was in May she was on prednisone from the end of May up until 05/25/2012. She was doing well up until yesterday evening around 10:30 PM she started having pain in the mid abdomen that went down to the right side she said it poked her like a nail. She continued having pain throughout the evening and saw Dr. Elnoria Hart approximately 8:30 AM. He sent her for a CT scan which shows tubular structure measuring 9 mm in the cross section slightly surrounding inflammation. On the prior studies this was location the appendix and there is concern that was appendicitis. There is also some mild edema in this cecum adjacent to the appendix were distally the colon wall is moderately thickened and the distal sigmoid extending to the rectum consistent with the rectosigmoid Crohn's involvement. She also has a moderately large 35 x 38 mm left ovarian cyst. We were asked to see in consultation for possible appendicitis.  Past Medical History  Diagnosis Date  . Asthma   . Crohn's disease   . PCOS (polycystic ovarian syndrome)   . PONV (postoperative nausea and vomiting)     Past Surgical History  Procedure Date  . Colonoscopy w/ biopsies   . Deviated septum repair   . Wisdom teeth removal   . Tympanoplasty     History reviewed. No pertinent family history. mother has adult onset diabetes mellitus. Father is in good health one brother who is overweight otherwise healthy.  Social History:  reports that she has never smoked. She has never used smokeless tobacco. She reports that she drinks alcohol. She reports that she does not use illicit drugs.  Allergies:  Allergies  Allergen Reactions  . Entocort Ec (Budesonide) Hives  . Other Other (See Comments)    Eggs-almost fainted, treenuts-blisters in mouth and throat, not  sure of allergic response to Shellfish  . Penicillins Hives  . Sulfa Antibiotics Nausea And Vomiting  . Eggs Or Egg-Derived Products Hives  . Shellfish Allergy     Medications:  Prior to Admission medications   Medication Sig Start Date End Date Taking? Authorizing Provider  acetaminophen (TYLENOL) 325 MG tablet Take 650 mg by mouth every 6 (six) hours as needed. For shoulder pain   Yes Historical Provider, MD  albuterol (PROVENTIL HFA;VENTOLIN HFA) 108 (90 BASE) MCG/ACT inhaler Inhale 2 puffs into the lungs every 4 (four) hours as needed. For shortness of breath.   Yes Historical Provider, MD  azaTHIOprine (IMURAN) 50 MG tablet Take 100 mg by mouth at bedtime.    Yes Historical Provider, MD  cetirizine (ZYRTEC) 10 MG tablet Take 10 mg by mouth daily as needed. For allergies.   Yes Historical Provider, MD  escitalopram (LEXAPRO) 20 MG tablet Take 20 mg by mouth at bedtime.   Yes Historical Provider, MD  norethindrone-ethinyl estradiol (JUNEL FE,GILDESS FE,LOESTRIN FE) 1-20 MG-MCG tablet Take 1 tablet by mouth daily.   Yes Historical Provider, MD   Prior to Admission:  (Not in a hospital admission) Scheduled:   . ondansetron (ZOFRAN) IV  4 mg Intravenous Once   Continuous:   . sodium chloride 1,000 mL (06/01/12 1510)   PRN: Anti-infectives    None      Results for orders placed during the hospital encounter of 06/01/12 (from the past 48 hour(s))  URINALYSIS, ROUTINE W  REFLEX MICROSCOPIC     Status: Abnormal   Collection Time   06/01/12  2:44 PM      Component Value Range Comment   Color, Urine YELLOW  YELLOW    APPearance CLEAR  CLEAR    Specific Gravity, Urine 1.005  1.005 - 1.030    pH 7.5  5.0 - 8.0    Glucose, UA NEGATIVE  NEGATIVE mg/dL    Hgb urine dipstick MODERATE (*) NEGATIVE    Bilirubin Urine NEGATIVE  NEGATIVE    Ketones, ur 40 (*) NEGATIVE mg/dL    Protein, ur NEGATIVE  NEGATIVE mg/dL    Urobilinogen, UA 0.2  0.0 - 1.0 mg/dL    Nitrite NEGATIVE  NEGATIVE     Leukocytes, UA NEGATIVE  NEGATIVE   URINE MICROSCOPIC-ADD ON     Status: Normal   Collection Time   06/01/12  2:44 PM      Component Value Range Comment   Squamous Epithelial / LPF RARE  RARE    WBC, UA 0-2  <3 WBC/hpf    RBC / HPF 3-6  <3 RBC/hpf   POCT PREGNANCY, URINE     Status: Normal   Collection Time   06/01/12  2:52 PM      Component Value Range Comment   Preg Test, Ur NEGATIVE  NEGATIVE   CBC WITH DIFFERENTIAL     Status: Abnormal   Collection Time   06/01/12  3:00 PM      Component Value Range Comment   WBC 14.6 (*) 4.0 - 10.5 K/uL    RBC 4.38  3.87 - 5.11 MIL/uL    Hemoglobin 14.1  12.0 - 15.0 g/dL    HCT 16.1  09.6 - 04.5 %    MCV 94.3  78.0 - 100.0 fL    MCH 32.2  26.0 - 34.0 pg    MCHC 34.1  30.0 - 36.0 g/dL    RDW 40.9  81.1 - 91.4 %    Platelets 343  150 - 400 K/uL    Neutrophils Relative 92 (*) 43 - 77 %    Neutro Abs 13.4 (*) 1.7 - 7.7 K/uL    Lymphocytes Relative 5 (*) 12 - 46 %    Lymphs Abs 0.8  0.7 - 4.0 K/uL    Monocytes Relative 3  3 - 12 %    Monocytes Absolute 0.4  0.1 - 1.0 K/uL    Eosinophils Relative 0  0 - 5 %    Eosinophils Absolute 0.0  0.0 - 0.7 K/uL    Basophils Relative 0  0 - 1 %    Basophils Absolute 0.0  0.0 - 0.1 K/uL   COMPREHENSIVE METABOLIC PANEL     Status: Abnormal   Collection Time   06/01/12  3:00 PM      Component Value Range Comment   Sodium 136  135 - 145 mEq/L    Potassium 3.4 (*) 3.5 - 5.1 mEq/L    Chloride 100  96 - 112 mEq/L    CO2 22  19 - 32 mEq/L    Glucose, Bld 117 (*) 70 - 99 mg/dL    BUN 8  6 - 23 mg/dL    Creatinine, Ser 7.82  0.50 - 1.10 mg/dL    Calcium 9.2  8.4 - 95.6 mg/dL    Total Protein 7.4  6.0 - 8.3 g/dL    Albumin 4.1  3.5 - 5.2 g/dL    AST 19  0 - 37 U/L  ALT 10  0 - 35 U/L    Alkaline Phosphatase 55  39 - 117 U/L    Total Bilirubin 0.4  0.3 - 1.2 mg/dL    GFR calc non Af Amer >90  >90 mL/min    GFR calc Af Amer >90  >90 mL/min     Ct Abdomen Pelvis W Contrast  06/01/2012  *RADIOLOGY  REPORT*  Clinical Data: Abdominal pain.  Recent prednisone treatment for Crohn's disease.  CT ABDOMEN AND PELVIS WITH CONTRAST  Technique:  Multidetector CT imaging of the abdomen and pelvis was performed following the standard protocol during bolus administration of intravenous contrast.  Contrast: 30mL OMNIPAQUE IOHEXOL 300 MG/ML  SOLN, OMNIPAQUE IOHEXOL 300 MG/ML  SOLN  Comparison: 05/26/2010 CT abdomen pelvis  Findings: Clear lung bases.  Normal liver, spleen, pancreas, kidneys, adrenal gland, and gallbladder.  Normal aorta and IVC. Normal stomach and small bowel.  In the right lower quadrant, there is a tubular structure overlying the psoas just below the ileocecal junction. This tubular structure measures 9 mm in cross section with slight surrounding inflammation.  On prior study this was the location of the appendix, and concern is raised for acute appendicitis.  There is mild edema of the medial cecum (images 48 - 52, series 2) which could represent a reaction to adjacent appendiceal inflammation or represent a manifestation of Crohn's disease.  There is no bowel obstruction. There is no small bowel wall thickening. Slight free fluid is noted in the right pelvis (image 60 series 2). More distally in the colon, there is moderate wall thickening of the distal sigmoid extending to the rectum. This area was abnormal previously.  Rectosigmoid Crohn's involvement is not excluded. Moderately large 35 x 38 mm left ovarian cyst.  Bladder unremarkable.  No signs of spondyloarthropathy.  Anatomic alignment lumbar spine.  IMPRESSION: Right lower quadrant tubular structure overlying the psoas could represent acute appendicitis.  Small amount free fluid right lower pelvis.  Moderate wall thickening distal sigmoid and proximal rectal region was site of previous inflammation, and is consistent with rectosigmoid Crohn's disease.  Original Report Authenticated By: Elsie Stain, M.D.    Review of Systems    Constitutional: Positive for fever and chills. Negative for weight loss, malaise/fatigue and diaphoresis.  HENT: Negative.   Eyes: Negative.   Respiratory: Negative.   Cardiovascular: Negative.   Gastrointestinal: Positive for nausea, abdominal pain, diarrhea (with flares) and constipation (most of the time she is constipated). Negative for heartburn and vomiting.  Genitourinary: Negative.   Musculoskeletal: Negative.   Skin: Positive for rash (had some on prednisone for most recent flare, but none currently).  Neurological: Negative.  Negative for weakness.  Endo/Heme/Allergies: Negative.   Psychiatric/Behavioral: The patient is nervous/anxious.    Blood pressure 129/68, pulse 121, temperature 99.7 F (37.6 C), temperature source Oral, resp. rate 20, last menstrual period 05/11/2012, SpO2 100.00%. Physical Exam  Constitutional: She is oriented to person, place, and time. She appears well-developed and well-nourished. No distress.       Temp 100.9 now  HENT:  Head: Normocephalic and atraumatic.  Nose: Nose normal.  Eyes: Conjunctivae and EOM are normal. Pupils are equal, round, and reactive to light. Right eye exhibits no discharge. Left eye exhibits no discharge. No scleral icterus.  Neck: Normal range of motion. Neck supple. No JVD present. No tracheal deviation present. No thyromegaly present.  Cardiovascular: Normal rate, regular rhythm, normal heart sounds and intact distal pulses.  Exam reveals no gallop.  No murmur heard. Respiratory: Effort normal and breath sounds normal. No stridor. No respiratory distress. She has no wheezes. She has no rales. She exhibits no tenderness.  GI: Soft. Bowel sounds are normal. She exhibits no distension and no mass. There is tenderness (right now she say tendreness is on both sides below umbilicus). There is no rebound and no guarding.  Musculoskeletal: She exhibits no edema and no tenderness.  Lymphadenopathy:    She has no cervical  adenopathy.  Neurological: She is alert and oriented to person, place, and time. No cranial nerve deficit.  Skin: Skin is warm and dry. No rash noted. She is not diaphoretic. No erythema.  Psychiatric: She has a normal mood and affect. Her behavior is normal. Judgment and thought content normal.    Assessment/Plan: 1. Acute appendicitis 2. Crohn's disease 3. History of asthma  Plan: Patient has been evaluated by Dr. Dwain Hart is his opinion she does have acute appendicitis. We plan to admit the patient for  laparoscopic appendectomy.  Caitlin Hart 06/01/2012, 4:56 PM

## 2012-06-01 NOTE — Op Note (Signed)
Preoperative diagnosis: #1 Crohn's disease #2 acute appendicitis Postoperative diagnosis: Acute suppurative appendicitis Procedure: Laparoscopic appendectomy Surgeon: Dr. Harden Mo Anesthesia: Gen. Endotracheal Specimens: Appendix to pathology Estimated blood loss: Minimal Complications: None Drains: None Sponge and needle count correct x2 at end of operation Disposition to recovery stable  Indications: This is a 31 year old female who presents with a history of Crohn's disease.Recently she had a flare up that included diarrhea and she completed steroids about a week ago. Last night she began developing epigastric pain that began radiating down into her right lower quadrant. Now she has pain mostly in his right lower quadrant as well as the left lower quadrant. She has an elevated white blood cell count and low-grade fever. She also has a CT scan it does appear that she has appendicitis. On her exam she has right lower quadrant tenderness that is significant. This is not like her normal Crohn's flares. I am concerned that she had appendicitis and she and I discussed the diagnostic laparoscopy and appendectomy.  Procedure: After informed consent was obtained the patient was taken to the operating room. She was administered ciprofloxacin and Flagyl. She had sequential compression devices placed on her legs. She was placed under general anesthesia without complication. A Foley catheter was placed. Her abdomen was then prepped and draped in the standard sterile surgical fashion. A surgical timeout was performed.  I infiltrated Marcaine below her umbilicus. I made a vertical incision. I entered her fascia sharply. Her peritoneum was entered bluntly. I placed a 0 Vicryl pursestring suture through the fascia. I then placed a Hassan trocar and insufflated the abdomen to 15 mm mercury pressure. I then placed 2 further 5 mm trocars in the suprapubic region and left lower quadrant after infiltration with  local anesthetic under direct vision without complication. Upon inspection she did not have any active Crohn's disease of her small bowel. Her cecum was a little bit inflamed and this was due to the fact that she did have appendicitis. I released her right colon from the sidewall as well as her appendix using the harmonic scalpel to take down the white line. Once I had done this I then took down the appendiceal mesentery which was thickened with the harmonic scalpel. Her small bowel was identified and was kept out of the way. As I got closer to her cecum I then encircled the mesentery and finished dividing this with the harmonic scalpel. Once I got to the base of the appendix I then placed a stapler and then removed the appendix with a small piece of the cecum associated with it as well. I then placed this in an Endo Catch bag and removed it. The base looked clean. This was viable. The staples were in good position and were hemostatic. I irrigated until this was clean. I then removed all trocars. The abdomen was desufflated. I then tied them are partially suture this completely obliterated the defect. I then closure with 4 Monocryl and Dermabond. She tolerated this well was extubated and transferred to recovery.

## 2012-06-01 NOTE — Anesthesia Procedure Notes (Signed)
Procedure Name: Intubation Date/Time: 06/01/2012 6:52 PM Performed by: Doran Clay Pre-anesthesia Checklist: Patient identified, Timeout performed, Emergency Drugs available, Suction available and Patient being monitored Patient Re-evaluated:Patient Re-evaluated prior to inductionOxygen Delivery Method: Circle system utilized Preoxygenation: Pre-oxygenation with 100% oxygen Intubation Type: IV induction, Rapid sequence and Cricoid Pressure applied Laryngoscope Size: Mac and 4 Grade View: Grade I Tube type: Oral Tube size: 7.0 mm Number of attempts: 1 Airway Equipment and Method: Stylet Placement Confirmation: ETT inserted through vocal cords under direct vision,  breath sounds checked- equal and bilateral and positive ETCO2 Secured at: 21 cm Tube secured with: Tape Dental Injury: Teeth and Oropharynx as per pre-operative assessment

## 2012-06-01 NOTE — ED Notes (Signed)
Pt presenting to ed with c/o sent by her doctor for appendicitis. Pt states she had ct scan prior to arrival and was told to present to ed. Pt states nausea and diarrhea. Pt states pain is 9/10

## 2012-06-01 NOTE — Progress Notes (Signed)
Confirmed with pt pcp is Caitlin Hart EPIC updated

## 2012-06-01 NOTE — Anesthesia Postprocedure Evaluation (Signed)
  Anesthesia Post-op Note  Patient: Caitlin Hart  Procedure(s) Performed: Procedure(s) (LRB): APPENDECTOMY LAPAROSCOPIC (N/A)  Patient Location: PACU  Anesthesia Type: General  Level of Consciousness: awake and alert   Airway and Oxygen Therapy: Patient Spontanous Breathing  Post-op Pain: mild  Post-op Assessment: Post-op Vital signs reviewed, Patient's Cardiovascular Status Stable, Respiratory Function Stable, Patent Airway and No signs of Nausea or vomiting  Post-op Vital Signs: stable  Complications: No apparent anesthesia complications

## 2012-06-01 NOTE — Anesthesia Preprocedure Evaluation (Signed)
Anesthesia Evaluation  Patient identified by MRN, date of birth, ID band Patient awake    Reviewed: Allergy & Precautions, H&P , NPO status , Patient's Chart, lab work & pertinent test results  History of Anesthesia Complications (+) PONV  Airway Mallampati: II TM Distance: >3 FB Neck ROM: full    Dental No notable dental hx. (+) Teeth Intact and Dental Advisory Given   Pulmonary neg pulmonary ROS, asthma ,  breath sounds clear to auscultation  Pulmonary exam normal       Cardiovascular Exercise Tolerance: Good negative cardio ROS  Rhythm:regular Rate:Normal     Neuro/Psych negative neurological ROS  negative psych ROS   GI/Hepatic negative GI ROS, Neg liver ROS,   Endo/Other  negative endocrine ROS  Renal/GU negative Renal ROS  negative genitourinary   Musculoskeletal   Abdominal   Peds  Hematology negative hematology ROS (+)   Anesthesia Other Findings   Reproductive/Obstetrics negative OB ROS                           Anesthesia Physical Anesthesia Plan  ASA: II  Anesthesia Plan: General   Post-op Pain Management:    Induction: Rapid sequence and Cricoid pressure planned  Airway Management Planned: Oral ETT  Additional Equipment:   Intra-op Plan:   Post-operative Plan: Extubation in OR  Informed Consent: I have reviewed the patients History and Physical, chart, labs and discussed the procedure including the risks, benefits and alternatives for the proposed anesthesia with the patient or authorized representative who has indicated his/her understanding and acceptance.   Dental Advisory Given  Plan Discussed with: CRNA and Surgeon  Anesthesia Plan Comments:         Anesthesia Quick Evaluation

## 2012-06-01 NOTE — Transfer of Care (Signed)
Immediate Anesthesia Transfer of Care Note  Patient: Caitlin Hart  Procedure(s) Performed: Procedure(s) (LRB): APPENDECTOMY LAPAROSCOPIC (N/A)  Patient Location: PACU  Anesthesia Type: General  Level of Consciousness: sedated  Airway & Oxygen Therapy: Patient Spontanous Breathing and Patient connected to face mask oxygen  Post-op Assessment: Report given to PACU RN and Post -op Vital signs reviewed and stable  Post vital signs: Reviewed and stable  Complications: No apparent anesthesia complications

## 2012-06-01 NOTE — ED Provider Notes (Signed)
History     CSN: [id number]  Arrival date & time 06/01/12  1403   First MD Initiated Contact with Patient 06/01/12 1521      Chief Complaint  Patient presents with  . Abdominal Pain    (Consider location/radiation/quality/duration/timing/severity/associated sxs/prior treatment) Patient is a 31 y.o. female presenting with abdominal pain. The history is provided by the patient.  Abdominal Pain The primary symptoms of the illness include abdominal pain and diarrhea. The primary symptoms of the illness do not include fever, nausea, vomiting, dysuria, vaginal discharge or vaginal bleeding. The current episode started yesterday. The onset of the illness was gradual. The problem has been gradually worsening.  The patient states that she believes she is currently not pregnant. Additional symptoms associated with the illness include anorexia. Symptoms associated with the illness do not include chills, constipation, urgency, hematuria, frequency or back pain. Significant associated medical issues include inflammatory bowel disease.  Pt states she went to her doctor with this pain, and had an outpatient CT scan done this morning, which showed possible appendicitis. Pt was sent here for further evaluation. Pt not wanting medications at this time.   Past Medical History  Diagnosis Date  . Asthma   . Crohn's disease   . PCOS (polycystic ovarian syndrome)     Past Surgical History  Procedure Date  . Colonoscopy w/ biopsies   . Deviated septum repair   . Wisdom teeth removal   . Tympanoplasty     No family history on file.  History  Substance Use Topics  . Smoking status: Never Smoker   . Smokeless tobacco: Never Used  . Alcohol Use: Yes     occ    OB History    Grav Para Term Preterm Abortions TAB SAB Ect Mult Living                  Review of Systems  Constitutional: Negative for fever and chills.  HENT: Negative for neck pain and neck stiffness.   Respiratory: Negative.     Cardiovascular: Negative.   Gastrointestinal: Positive for abdominal pain, diarrhea and anorexia. Negative for nausea, vomiting and constipation.  Genitourinary: Negative for dysuria, urgency, frequency, hematuria, vaginal bleeding, vaginal discharge and vaginal pain.  Musculoskeletal: Negative for back pain.  Skin: Negative.   Neurological: Negative for dizziness, weakness and headaches.    Allergies  Other; Penicillins; Sulfa antibiotics; Eggs or egg-derived products; and Shellfish allergy  Home Medications   Current Outpatient Rx  Name Route Sig Dispense Refill  . ACETAMINOPHEN 325 MG PO TABS Oral Take 650 mg by mouth every 6 (six) hours as needed. For shoulder pain    . ALBUTEROL SULFATE HFA 108 (90 BASE) MCG/ACT IN AERS Inhalation Inhale 2 puffs into the lungs every 4 (four) hours as needed. For shortness of breath.    . AZATHIOPRINE 50 MG PO TABS Oral Take 100 mg by mouth at bedtime.     Marland Kitchen CETIRIZINE HCL 10 MG PO TABS Oral Take 10 mg by mouth daily as needed. For allergies.    Marland Kitchen ESCITALOPRAM OXALATE 20 MG PO TABS Oral Take 20 mg by mouth at bedtime.    Azzie Roup ACE-ETH ESTRAD-FE 1-20 MG-MCG PO TABS Oral Take 1 tablet by mouth daily.      BP 129/68  Pulse 121  Temp 99.7 F (37.6 C) (Oral)  Resp 20  SpO2 100%  LMP 05/11/2012  Physical Exam  Nursing note and vitals reviewed. Constitutional: She is oriented to person, place,  and time. She appears well-developed and well-nourished. No distress.  HENT:  Head: Normocephalic.  Eyes: Conjunctivae are normal.  Neck: Neck supple.  Cardiovascular: Normal rate, regular rhythm and normal heart sounds.   Pulmonary/Chest: Effort normal and breath sounds normal. No respiratory distress. She has no wheezes. She has no rales.  Abdominal: Soft. Bowel sounds are normal. She exhibits no distension. There is no rebound.       RLQ and LLQ tenderness on exam, guarding  Musculoskeletal: Normal range of motion. She exhibits no edema.   Neurological: She is alert and oriented to person, place, and time.  Skin: Skin is warm and dry.  Psychiatric: She has a normal mood and affect.    ED Course  Procedures (including critical care time)  Labs Reviewed  CBC WITH DIFFERENTIAL - Abnormal; Notable for the following:    WBC 14.6 (*)     Neutrophils Relative 92 (*)     Neutro Abs 13.4 (*)     Lymphocytes Relative 5 (*)     All other components within normal limits  COMPREHENSIVE METABOLIC PANEL - Abnormal; Notable for the following:    Potassium 3.4 (*)     Glucose, Bld 117 (*)     All other components within normal limits  POCT PREGNANCY, URINE  URINALYSIS, ROUTINE W REFLEX MICROSCOPIC   Ct Abdomen Pelvis W Contrast  06/01/2012  *RADIOLOGY REPORT*  Clinical Data: Abdominal pain.  Recent prednisone treatment for Crohn's disease.  CT ABDOMEN AND PELVIS WITH CONTRAST  Technique:  Multidetector CT imaging of the abdomen and pelvis was performed following the standard protocol during bolus administration of intravenous contrast.  Contrast: 30mL OMNIPAQUE IOHEXOL 300 MG/ML  SOLN, OMNIPAQUE IOHEXOL 300 MG/ML  SOLN  Comparison: 05/26/2010 CT abdomen pelvis  Findings: Clear lung bases.  Normal liver, spleen, pancreas, kidneys, adrenal gland, and gallbladder.  Normal aorta and IVC. Normal stomach and small bowel.  In the right lower quadrant, there is a tubular structure overlying the psoas just below the ileocecal junction. This tubular structure measures 9 mm in cross section with slight surrounding inflammation.  On prior study this was the location of the appendix, and concern is raised for acute appendicitis.  There is mild edema of the medial cecum (images 48 - 52, series 2) which could represent a reaction to adjacent appendiceal inflammation or represent a manifestation of Crohn's disease.  There is no bowel obstruction. There is no small bowel wall thickening. Slight free fluid is noted in the right pelvis (image 60 series 2).  More distally in the colon, there is moderate wall thickening of the distal sigmoid extending to the rectum. This area was abnormal previously.  Rectosigmoid Crohn's involvement is not excluded. Moderately large 35 x 38 mm left ovarian cyst.  Bladder unremarkable.  No signs of spondyloarthropathy.  Anatomic alignment lumbar spine.  IMPRESSION: Right lower quadrant tubular structure overlying the psoas could represent acute appendicitis.  Small amount free fluid right lower pelvis.  Moderate wall thickening distal sigmoid and proximal rectal region was site of previous inflammation, and is consistent with rectosigmoid Crohn's disease.  Original Report Authenticated By: Elsie Stain, M.D.   4:15 PM Pt discussed with Dr. Jeraldine Loots. Possible appy. Dr. Jeraldine Loots to call cardiology. Pt is tachycardic, afebrile, she appears comfortable, not wanting any medications at this time.   No diagnosis found.    MDM          Lottie Mussel, PA 06/03/12 0100

## 2012-06-01 NOTE — Addendum Note (Signed)
Addendum  created 06/01/12 2033 by Gaetano Hawthorne, MD   Modules edited:Orders

## 2012-06-01 NOTE — ED Notes (Signed)
MD at bedside. 

## 2012-06-01 NOTE — H&P (Signed)
Reason for Consult: Appendicits  Referring Physician: [facility]: Dr. Mann  Caitlin Hart is an 31 y.o. female.  HPI: Patient is a 31-year-old female with history of Crohn's disease. Her last flare was in May she was on prednisone from the end of May up until 05/25/2012. She was doing well up until yesterday evening around 10:30 PM she started having pain in the mid abdomen that went down to the right side she said it poked her like a nail. She continued having pain throughout the evening and saw Dr. Hung approximately 8:30 AM. He sent her for a CT scan which shows tubular structure measuring 9 mm in the cross section slightly surrounding inflammation. On the prior studies this was location the appendix and there is concern that was appendicitis. There is also some mild edema in this cecum adjacent to the appendix were distally the colon wall is moderately thickened and the distal sigmoid extending to the rectum consistent with the rectosigmoid Crohn's involvement. She also has a moderately large 35 x 38 mm left ovarian cyst. We were asked to see in consultation for possible appendicitis.  Past Medical History  Diagnosis Date  . Asthma   . Crohn's disease   . PCOS (polycystic ovarian syndrome)   . PONV (postoperative nausea and vomiting)     Past Surgical History  Procedure Date  . Colonoscopy w/ biopsies   . Deviated septum repair   . Wisdom teeth removal   . Tympanoplasty     History reviewed. No pertinent family history. mother has adult onset diabetes mellitus. Father is in good health one brother who is overweight otherwise healthy.  Social History:  reports that she has never smoked. She has never used smokeless tobacco. She reports that she drinks alcohol. She reports that she does not use illicit drugs.  Allergies:  Allergies  Allergen Reactions  . Entocort Ec (Budesonide) Hives  . Other Other (See Comments)    Eggs-almost fainted, treenuts-blisters in mouth and throat, not  sure of allergic response to Shellfish  . Penicillins Hives  . Sulfa Antibiotics Nausea And Vomiting  . Eggs Or Egg-Derived Products Hives  . Shellfish Allergy     Medications:  Prior to Admission medications   Medication Sig Start Date End Date Taking? Authorizing Provider  acetaminophen (TYLENOL) 325 MG tablet Take 650 mg by mouth every 6 (six) hours as needed. For shoulder pain   Yes Historical Provider, MD  albuterol (PROVENTIL HFA;VENTOLIN HFA) 108 (90 BASE) MCG/ACT inhaler Inhale 2 puffs into the lungs every 4 (four) hours as needed. For shortness of breath.   Yes Historical Provider, MD  azaTHIOprine (IMURAN) 50 MG tablet Take 100 mg by mouth at bedtime.    Yes Historical Provider, MD  cetirizine (ZYRTEC) 10 MG tablet Take 10 mg by mouth daily as needed. For allergies.   Yes Historical Provider, MD  escitalopram (LEXAPRO) 20 MG tablet Take 20 mg by mouth at bedtime.   Yes Historical Provider, MD  norethindrone-ethinyl estradiol (JUNEL FE,GILDESS FE,LOESTRIN FE) 1-20 MG-MCG tablet Take 1 tablet by mouth daily.   Yes Historical Provider, MD   Prior to Admission:  (Not in a hospital admission) Scheduled:   . ondansetron (ZOFRAN) IV  4 mg Intravenous Once   Continuous:   . sodium chloride 1,000 mL (06/01/12 1510)   PRN: Anti-infectives    None      Results for orders placed during the hospital encounter of 06/01/12 (from the past 48 hour(s))  URINALYSIS, ROUTINE W   REFLEX MICROSCOPIC     Status: Abnormal   Collection Time   06/01/12  2:44 PM      Component Value Range Comment   Color, Urine YELLOW  YELLOW    APPearance CLEAR  CLEAR    Specific Gravity, Urine 1.005  1.005 - 1.030    pH 7.5  5.0 - 8.0    Glucose, UA NEGATIVE  NEGATIVE mg/dL    Hgb urine dipstick MODERATE (*) NEGATIVE    Bilirubin Urine NEGATIVE  NEGATIVE    Ketones, ur 40 (*) NEGATIVE mg/dL    Protein, ur NEGATIVE  NEGATIVE mg/dL    Urobilinogen, UA 0.2  0.0 - 1.0 mg/dL    Nitrite NEGATIVE  NEGATIVE     Leukocytes, UA NEGATIVE  NEGATIVE   URINE MICROSCOPIC-ADD ON     Status: Normal   Collection Time   06/01/12  2:44 PM      Component Value Range Comment   Squamous Epithelial / LPF RARE  RARE    WBC, UA 0-2  <3 WBC/hpf    RBC / HPF 3-6  <3 RBC/hpf   POCT PREGNANCY, URINE     Status: Normal   Collection Time   06/01/12  2:52 PM      Component Value Range Comment   Preg Test, Ur NEGATIVE  NEGATIVE   CBC WITH DIFFERENTIAL     Status: Abnormal   Collection Time   06/01/12  3:00 PM      Component Value Range Comment   WBC 14.6 (*) 4.0 - 10.5 K/uL    RBC 4.38  3.87 - 5.11 MIL/uL    Hemoglobin 14.1  12.0 - 15.0 g/dL    HCT 41.3  36.0 - 46.0 %    MCV 94.3  78.0 - 100.0 fL    MCH 32.2  26.0 - 34.0 pg    MCHC 34.1  30.0 - 36.0 g/dL    RDW 13.9  11.5 - 15.5 %    Platelets 343  150 - 400 K/uL    Neutrophils Relative 92 (*) 43 - 77 %    Neutro Abs 13.4 (*) 1.7 - 7.7 K/uL    Lymphocytes Relative 5 (*) 12 - 46 %    Lymphs Abs 0.8  0.7 - 4.0 K/uL    Monocytes Relative 3  3 - 12 %    Monocytes Absolute 0.4  0.1 - 1.0 K/uL    Eosinophils Relative 0  0 - 5 %    Eosinophils Absolute 0.0  0.0 - 0.7 K/uL    Basophils Relative 0  0 - 1 %    Basophils Absolute 0.0  0.0 - 0.1 K/uL   COMPREHENSIVE METABOLIC PANEL     Status: Abnormal   Collection Time   06/01/12  3:00 PM      Component Value Range Comment   Sodium 136  135 - 145 mEq/L    Potassium 3.4 (*) 3.5 - 5.1 mEq/L    Chloride 100  96 - 112 mEq/L    CO2 22  19 - 32 mEq/L    Glucose, Bld 117 (*) 70 - 99 mg/dL    BUN 8  6 - 23 mg/dL    Creatinine, Ser 0.66  0.50 - 1.10 mg/dL    Calcium 9.2  8.4 - 10.5 mg/dL    Total Protein 7.4  6.0 - 8.3 g/dL    Albumin 4.1  3.5 - 5.2 g/dL    AST 19  0 - 37 U/L      ALT 10  0 - 35 U/L    Alkaline Phosphatase 55  39 - 117 U/L    Total Bilirubin 0.4  0.3 - 1.2 mg/dL    GFR calc non Af Amer >90  >90 mL/min    GFR calc Af Amer >90  >90 mL/min     Ct Abdomen Pelvis W Contrast  06/01/2012  *RADIOLOGY  REPORT*  Clinical Data: Abdominal pain.  Recent prednisone treatment for Crohn's disease.  CT ABDOMEN AND PELVIS WITH CONTRAST  Technique:  Multidetector CT imaging of the abdomen and pelvis was performed following the standard protocol during bolus administration of intravenous contrast.  Contrast: 30mL OMNIPAQUE IOHEXOL 300 MG/ML  SOLN, 100mL OMNIPAQUE IOHEXOL 300 MG/ML  SOLN  Comparison: 05/26/2010 CT abdomen pelvis  Findings: Clear lung bases.  Normal liver, spleen, pancreas, kidneys, adrenal gland, and gallbladder.  Normal aorta and IVC. Normal stomach and small bowel.  In the right lower quadrant, there is a tubular structure overlying the psoas just below the ileocecal junction. This tubular structure measures 9 mm in cross section with slight surrounding inflammation.  On prior study this was the location of the appendix, and concern is raised for acute appendicitis.  There is mild edema of the medial cecum (images 48 - 52, series 2) which could represent a reaction to adjacent appendiceal inflammation or represent a manifestation of Crohn's disease.  There is no bowel obstruction. There is no small bowel wall thickening. Slight free fluid is noted in the right pelvis (image 60 series 2). More distally in the colon, there is moderate wall thickening of the distal sigmoid extending to the rectum. This area was abnormal previously.  Rectosigmoid Crohn's involvement is not excluded. Moderately large 35 x 38 mm left ovarian cyst.  Bladder unremarkable.  No signs of spondyloarthropathy.  Anatomic alignment lumbar spine.  IMPRESSION: Right lower quadrant tubular structure overlying the psoas could represent acute appendicitis.  Small amount free fluid right lower pelvis.  Moderate wall thickening distal sigmoid and proximal rectal region was site of previous inflammation, and is consistent with rectosigmoid Crohn's disease.  Original Report Authenticated By: JOHN T. CURNES, M.D.    Review of Systems    Constitutional: Positive for fever and chills. Negative for weight loss, malaise/fatigue and diaphoresis.  HENT: Negative.   Eyes: Negative.   Respiratory: Negative.   Cardiovascular: Negative.   Gastrointestinal: Positive for nausea, abdominal pain, diarrhea (with flares) and constipation (most of the time she is constipated). Negative for heartburn and vomiting.  Genitourinary: Negative.   Musculoskeletal: Negative.   Skin: Positive for rash (had some on prednisone for most recent flare, but none currently).  Neurological: Negative.  Negative for weakness.  Endo/Heme/Allergies: Negative.   Psychiatric/Behavioral: The patient is nervous/anxious.    Blood pressure 129/68, pulse 121, temperature 99.7 F (37.6 C), temperature source Oral, resp. rate 20, last menstrual period 05/11/2012, SpO2 100.00%. Physical Exam  Constitutional: She is oriented to person, place, and time. She appears well-developed and well-nourished. No distress.       Temp 100.9 now  HENT:  Head: Normocephalic and atraumatic.  Nose: Nose normal.  Eyes: Conjunctivae and EOM are normal. Pupils are equal, round, and reactive to light. Right eye exhibits no discharge. Left eye exhibits no discharge. No scleral icterus.  Neck: Normal range of motion. Neck supple. No JVD present. No tracheal deviation present. No thyromegaly present.  Cardiovascular: Normal rate, regular rhythm, normal heart sounds and intact distal pulses.  Exam reveals no gallop.     No murmur heard. Respiratory: Effort normal and breath sounds normal. No stridor. No respiratory distress. She has no wheezes. She has no rales. She exhibits no tenderness.  GI: Soft. Bowel sounds are normal. She exhibits no distension and no mass. There is tenderness (right now she say tendreness is on both sides below umbilicus). There is no rebound and no guarding.  Musculoskeletal: She exhibits no edema and no tenderness.  Lymphadenopathy:    She has no cervical  adenopathy.  Neurological: She is alert and oriented to person, place, and time. No cranial nerve deficit.  Skin: Skin is warm and dry. No rash noted. She is not diaphoretic. No erythema.  Psychiatric: She has a normal mood and affect. Her behavior is normal. Judgment and thought content normal.    Assessment/Plan: 1. Acute appendicitis 2. Crohn's disease 3. History of asthma  Plan: Patient has been evaluated by Dr. Wakefield is his opinion she does have acute appendicitis. We plan to admit the patient for  laparoscopic appendectomy.  Dewaun Kinzler 06/01/2012, 4:56 PM      

## 2012-06-01 NOTE — H&P (Signed)
I think she likely has appendicitis by history, exam and ct.  This is not like her normal crohns disease.  I discussed a diagnostic laparoscopy with appendectomy. Risks include but not limited to bleeding, postop infection, appendix normal, open procedure.

## 2012-06-02 ENCOUNTER — Encounter (HOSPITAL_COMMUNITY): Payer: Self-pay | Admitting: General Surgery

## 2012-06-02 LAB — CBC
HCT: 37.1 % (ref 36.0–46.0)
Hemoglobin: 12.5 g/dL (ref 12.0–15.0)
MCH: 32.1 pg (ref 26.0–34.0)
MCHC: 33.7 g/dL (ref 30.0–36.0)

## 2012-06-02 LAB — BASIC METABOLIC PANEL
BUN: 6 mg/dL (ref 6–23)
Chloride: 106 mEq/L (ref 96–112)
Glucose, Bld: 170 mg/dL — ABNORMAL HIGH (ref 70–99)
Potassium: 3.9 mEq/L (ref 3.5–5.1)

## 2012-06-02 NOTE — Progress Notes (Signed)
Advance diet.  looks well

## 2012-06-02 NOTE — Progress Notes (Signed)
Utilization review completed.  

## 2012-06-02 NOTE — Progress Notes (Signed)
1 Day Post-Op  Subjective: Doing well, tender and some shoulder pain but doing well. Objective: Vital signs in last 24 hours: Temp:  [97.4 F (36.3 C)-99.7 F (37.6 C)] 98.4 F (36.9 C) (07/25 0600) Pulse Rate:  [77-125] 77  (07/25 0600) Resp:  [15-26] 18  (07/25 0600) BP: (100-140)/(57-88) 100/57 mmHg (07/25 0600) SpO2:  [96 %-100 %] 96 % (07/25 0600) Weight:  [62.559 kg (137 lb 14.7 oz)] 62.559 kg (137 lb 14.7 oz) (07/24 2300) Last BM Date: 06/01/12  Intake/Output from previous day: 07/24 0701 - 07/25 0700 In: 2481.7 [I.V.:2481.7] Out: 1250 [Urine:1200; Emesis/NG output:50] Intake/Output this shift: Total I/O In: 423.3 [I.V.:423.3] Out: -   General appearance: alert, cooperative and no distress Resp: clear to auscultation bilaterally GI: soft, mildly distended, but not bad walks well taking clears without difficulty.  Lab Results:   Basename 06/02/12 0339 06/01/12 1500  WBC 11.6* 14.6*  HGB 12.5 14.1  HCT 37.1 41.3  PLT 300 343    BMET  Basename 06/02/12 0339 06/01/12 1500  NA 138 136  K 3.9 3.4*  CL 106 100  CO2 22 22  GLUCOSE 170* 117*  BUN 6 8  CREATININE 0.53 0.66  CALCIUM 8.3* 9.2   PT/INR No results found for this basename: LABPROT:2,INR:2 in the last 72 hours   Lab 06/01/12 1500  AST 19  ALT 10  ALKPHOS 55  BILITOT 0.4  PROT 7.4  ALBUMIN 4.1     Lipase     Component Value Date/Time   LIPASE 36 12/13/2011 1405     Studies/Results: Ct Abdomen Pelvis W Contrast  06/01/2012  *RADIOLOGY REPORT*  Clinical Data: Abdominal pain.  Recent prednisone treatment for Crohn's disease.  CT ABDOMEN AND PELVIS WITH CONTRAST  Technique:  Multidetector CT imaging of the abdomen and pelvis was performed following the standard protocol during bolus administration of intravenous contrast.  Contrast: 30mL OMNIPAQUE IOHEXOL 300 MG/ML  SOLN, OMNIPAQUE IOHEXOL 300 MG/ML  SOLN  Comparison: 05/26/2010 CT abdomen pelvis  Findings: Clear lung bases.  Normal liver,  spleen, pancreas, kidneys, adrenal gland, and gallbladder.  Normal aorta and IVC. Normal stomach and small bowel.  In the right lower quadrant, there is a tubular structure overlying the psoas just below the ileocecal junction. This tubular structure measures 9 mm in cross section with slight surrounding inflammation.  On prior study this was the location of the appendix, and concern is raised for acute appendicitis.  There is mild edema of the medial cecum (images 48 - 52, series 2) which could represent a reaction to adjacent appendiceal inflammation or represent a manifestation of Crohn's disease.  There is no bowel obstruction. There is no small bowel wall thickening. Slight free fluid is noted in the right pelvis (image 60 series 2). More distally in the colon, there is moderate wall thickening of the distal sigmoid extending to the rectum. This area was abnormal previously.  Rectosigmoid Crohn's involvement is not excluded. Moderately large 35 x 38 mm left ovarian cyst.  Bladder unremarkable.  No signs of spondyloarthropathy.  Anatomic alignment lumbar spine.  IMPRESSION: Right lower quadrant tubular structure overlying the psoas could represent acute appendicitis.  Small amount free fluid right lower pelvis.  Moderate wall thickening distal sigmoid and proximal rectal region was site of previous inflammation, and is consistent with rectosigmoid Crohn's disease.  Original Report Authenticated By: Elsie Stain, M.D.    Medications:    . azaTHIOprine  100 mg Oral QHS  . ciprofloxacin  400 mg Intravenous Q12H  . escitalopram  20 mg Oral QHS  . loratadine  10 mg Oral Daily  . metronidazole  500 mg Intravenous Q8H  . ondansetron (ZOFRAN) IV  4 mg Intravenous Once  . promethazine      . DISCONTD: ciprofloxacin  400 mg Intravenous Q12H  . DISCONTD: metronidazole  500 mg Intravenous Q8H    Assessment/Plan 1. Acute appendicitis s/p Lap appendectomy Dr. Dwain Sarna 06/01/12 2. Crohn's disease  3.  History of asthma    Plan:  24 hours of antibiotics, advance diet, i talked with Dr. Elnoria Howard, she just needs to stay on Imuran.  Hopefully home in AM.      LOS: 1 day    Caitlin Hart 06/02/2012

## 2012-06-03 LAB — COMPREHENSIVE METABOLIC PANEL
AST: 15 U/L (ref 0–37)
BUN: 8 mg/dL (ref 6–23)
CO2: 25 mEq/L (ref 19–32)
Calcium: 8.2 mg/dL — ABNORMAL LOW (ref 8.4–10.5)
Creatinine, Ser: 0.65 mg/dL (ref 0.50–1.10)
GFR calc Af Amer: >90 mL/min (ref >90)
GFR calc non Af Amer: >90 mL/min (ref >90)

## 2012-06-03 LAB — CBC
MCH: 31.7 pg (ref 26.0–34.0)
MCV: 97.7 fL (ref 78.0–100.0)
Platelets: 244 10*3/uL (ref 150–400)
RBC: 3.44 MIL/uL — ABNORMAL LOW (ref 3.87–5.11)

## 2012-06-03 MED ORDER — OXYCODONE HCL 5 MG PO TABS: 5.0000 mg | ORAL_TABLET | ORAL | Status: AC | PRN

## 2012-06-03 NOTE — Progress Notes (Signed)
Discharge summary sent to payer through [facility]  

## 2012-06-03 NOTE — Progress Notes (Signed)
Patient provided with discharge instructions and prescription. Patient verbalized understanding. Patient discharged to home. 

## 2012-06-03 NOTE — Discharge Summary (Signed)
Physician Discharge Summary  Patient ID: Caitlin Hart MRN: [id number] DOB/AGE: [birth year] 31 y.o.  Admit date: 06/01/2012 Discharge date: 06/03/2012  Admission Diagnoses: Acute suppurative appendicitis Crohn's disease   History of asthma    Discharge Diagnoses: Same Active Problems:  * No active hospital problems. *    PROCEDURES: Laparoscopic appendectomy 06/01/12  Hospital Course: Patient is a 31 year old female with history of Crohn's disease. Her last flare was in May she was on prednisone from the end of May up until 05/25/2012. She was doing well up until yesterday evening around 10:30 PM she started having pain in the mid abdomen that went down to the right side she said it poked her like a nail. She continued having pain throughout the evening and saw Dr. Elnoria Howard approximately 8:30 AM. He sent her for a CT scan which shows tubular structure measuring 9 mm in the cross section slightly surrounding inflammation. On the prior studies this was location the appendix and there is concern that was appendicitis. There is also some mild edema in this cecum adjacent to the appendix were distally the colon wall is moderately thickened and the distal sigmoid extending to the rectum consistent with the rectosigmoid Crohn's involvement. She also has a moderately large 35 x 38 mm left ovarian cyst. We were asked to see in consultation for possible appendicitis.  After review she was take to the OR and underwent appendectomy.  She was kept on 24 hours of IV antibiotics, and she has done well Labs are normal, she's taking PO's well, and her only complaint is gas, and shoulder pain. We plan discharge today.    Condition on D/C: Improved   Disposition: 01-Home or Self Care   Medication List  As of 06/03/2012  9:37 AM   TAKE these medications         acetaminophen 325 MG tablet   Commonly known as: TYLENOL   Take 650 mg by mouth every 6 (six) hours as needed. For shoulder pain      albuterol 108  (90 BASE) MCG/ACT inhaler   Commonly known as: PROVENTIL HFA;VENTOLIN HFA   Inhale 2 puffs into the lungs every 4 (four) hours as needed. For shortness of breath.      azaTHIOprine 50 MG tablet   Commonly known as: IMURAN   Take 100 mg by mouth at bedtime.      cetirizine 10 MG tablet   Commonly known as: ZYRTEC   Take 10 mg by mouth daily as needed. For allergies.      escitalopram 20 MG tablet   Commonly known as: LEXAPRO   Take 20 mg by mouth at bedtime.      norethindrone-ethinyl estradiol 1-20 MG-MCG tablet   Commonly known as: JUNEL FE,GILDESS FE,LOESTRIN FE   Take 1 tablet by mouth daily.      oxyCODONE 5 MG immediate release tablet   Commonly known as: Oxy IR/ROXICODONE   Take 1 tablet (5 mg total) by mouth every 4 (four) hours as needed.           Follow-up Information    Follow up with Paulino Rily, MD. (Call for follow up as needed.)    Contact information:   [address] [facility] [place] [address] [phone]       Call Charna Elizabeth, MD. (See when they want to see you again.)    Contact information:   [address], [place] [address] [phone]       Please follow  up. (no lifting over 20 pounds for 4 weeks.)       Follow up with Emelia Loron, MD. Schedule an appointment as soon as possible for a visit in 2 weeks.   Contact information:   [employer] [address] [place] [address] [phone]          Signed: Sherrie George 06/03/2012, 9:37 AM

## 2012-06-03 NOTE — Progress Notes (Signed)
2 Days Post-Op  Subjective: Only complaint is gas pain tolerating diet, + loose BM yesterday.  Objective: Vital signs in last 24 hours: Temp:  [97.4 F (36.3 C)-99 F (37.2 C)] 98.3 F (36.8 C) (07/26 0600) Pulse Rate:  [65-79] 68  (07/26 0600) Resp:  [18] 18  (07/26 0600) BP: (102-118)/(63-75) 115/72 mmHg (07/26 0600) SpO2:  [97 %-99 %] 99 % (07/26 0600) Last BM Date: 06/02/12  640 PO recorded, regular diet, afebrile, VSS, labs OK  Intake/Output from previous day: 07/25 0701 - 07/26 0700 In: 2378.3 [P.O.:600; I.V.:1778.3] Out: 2350 [Urine:2350] Intake/Output this shift: Total I/O In: 240 [P.O.:240] Out: -   General appearance: alert, cooperative and no distress GI: soft, not overly tender +BS +BM  Lab Results:   Basename 06/03/12 0320 06/02/12 0339  WBC 9.8 11.6*  HGB 10.9* 12.5  HCT 33.6* 37.1  PLT 244 300    BMET  Basename 06/03/12 0320 06/02/12 0339  NA 139 138  K 3.5 3.9  CL 108 106  CO2 25 22  GLUCOSE 111* 170*  BUN 8 6  CREATININE 0.65 0.53  CALCIUM 8.2* 8.3*   PT/INR No results found for this basename: LABPROT:2,INR:2 in the last 72 hours   Lab 06/03/12 0320 06/01/12 1500  AST 15 19  ALT 8 10  ALKPHOS 35* 55  BILITOT 0.1* 0.4  PROT 5.6* 7.4  ALBUMIN 2.8* 4.1     Lipase     Component Value Date/Time   LIPASE 36 12/13/2011 1405     Studies/Results: Ct Abdomen Pelvis W Contrast  06/01/2012  *RADIOLOGY REPORT*  Clinical Data: Abdominal pain.  Recent prednisone treatment for Crohn's disease.  CT ABDOMEN AND PELVIS WITH CONTRAST  Technique:  Multidetector CT imaging of the abdomen and pelvis was performed following the standard protocol during bolus administration of intravenous contrast.  Contrast: 30mL OMNIPAQUE IOHEXOL 300 MG/ML  SOLN, OMNIPAQUE IOHEXOL 300 MG/ML  SOLN  Comparison: 05/26/2010 CT abdomen pelvis  Findings: Clear lung bases.  Normal liver, spleen, pancreas, kidneys, adrenal gland, and gallbladder.  Normal aorta and IVC.  Normal stomach and small bowel.  In the right lower quadrant, there is a tubular structure overlying the psoas just below the ileocecal junction. This tubular structure measures 9 mm in cross section with slight surrounding inflammation.  On prior study this was the location of the appendix, and concern is raised for acute appendicitis.  There is mild edema of the medial cecum (images 48 - 52, series 2) which could represent a reaction to adjacent appendiceal inflammation or represent a manifestation of Crohn's disease.  There is no bowel obstruction. There is no small bowel wall thickening. Slight free fluid is noted in the right pelvis (image 60 series 2). More distally in the colon, there is moderate wall thickening of the distal sigmoid extending to the rectum. This area was abnormal previously.  Rectosigmoid Crohn's involvement is not excluded. Moderately large 35 x 38 mm left ovarian cyst.  Bladder unremarkable.  No signs of spondyloarthropathy.  Anatomic alignment lumbar spine.  IMPRESSION: Right lower quadrant tubular structure overlying the psoas could represent acute appendicitis.  Small amount free fluid right lower pelvis.  Moderate wall thickening distal sigmoid and proximal rectal region was site of previous inflammation, and is consistent with rectosigmoid Crohn's disease.  Original Report Authenticated By: Elsie Stain, M.D.    Medications:    . azaTHIOprine  100 mg Oral QHS  . ciprofloxacin  400 mg Intravenous Q12H  . escitalopram  20 mg Oral QHS  . loratadine  10 mg Oral Daily  . metronidazole  500 mg Intravenous Q8H    Assessment/Plan 1. Acute appendicitis s/p Lap appendectomy Dr. Dwain Sarna 06/01/12  2. Crohn's disease  3. History of asthma   Plan:  Home today, home meds and analgesic.     LOS: 2 days    Caitlin Hart 06/03/2012

## 2012-06-03 NOTE — ED Provider Notes (Signed)
This encounter was conducted as a shared visit. On my exam this young female with history of Crohn's disease was in no distress, though she had a diffusely tender abdomen, with focal tenderness about the right lower quadrant.  The patient's vital signs were stable, though she was moderately tachycardic, and sinus tach on my exam.  The patient was awake, alert, oriented.  The patient had a leukocytosis, and CT evidence of acute appendicitis.  I reviewed the findings with the patient and her mother. I discussed the case with our surgeon on-call.  The patient was taken to the OR for appendectomy.  A post encounter chart review demonstrates the patient did have appendicitis.   Gerhard Munch, MD 06/03/12 1009

## 2012-06-09 DIAGNOSIS — D689 Coagulation defect, unspecified: Secondary | ICD-10-CM

## 2012-06-09 HISTORY — DX: Coagulation defect, unspecified: D68.9

## 2012-06-10 ENCOUNTER — Telehealth (INDEPENDENT_AMBULATORY_CARE_PROVIDER_SITE_OTHER): Payer: Self-pay | Admitting: General Surgery

## 2012-06-10 NOTE — Telephone Encounter (Signed)
Pt called to report new onset of pain on the Lt side in her back and shoulder.  Calling to ask if it in any way might be relatable to her recent surgery.  Pt had lap appe on 7/24 (10 days ago.)  Advised her to call her PCP, as this new pain is not from her recent surgery, as it was on the opposite side and in abdomen.  She will call her PCP for advice.

## 2012-06-12 NOTE — Progress Notes (Signed)
Sophiagrace Benbrook M. Ashlea Dusing, MD, FACS General, Bariatric, & Minimally Invasive Surgery [facility]  

## 2012-06-13 ENCOUNTER — Telehealth (INDEPENDENT_AMBULATORY_CARE_PROVIDER_SITE_OTHER): Payer: Self-pay | Admitting: General Surgery

## 2012-06-13 NOTE — Discharge Summary (Signed)
Agree with above 

## 2012-06-13 NOTE — Telephone Encounter (Signed)
Pt wants to know (1) when can she swim and (2) when can she get tattoo?  Please advise.

## 2012-06-15 ENCOUNTER — Inpatient Hospital Stay (HOSPITAL_COMMUNITY)
Admission: EM | Admit: 2012-06-15 | Discharge: 2012-06-17 | DRG: 176 | Disposition: A | Discharge status: Home / Self Care | Payer: PRIVATE HEALTH INSURANCE | Attending: Internal Medicine | Admitting: Internal Medicine

## 2012-06-15 ENCOUNTER — Encounter (HOSPITAL_COMMUNITY): Payer: Self-pay | Admitting: Adult Health

## 2012-06-15 ENCOUNTER — Other Ambulatory Visit: Payer: Self-pay | Admitting: Family Medicine

## 2012-06-15 ENCOUNTER — Ambulatory Visit
Admission: RE | Admit: 2012-06-15 | Discharge: 2012-06-15 | Disposition: A | Discharge status: Home / Self Care | Payer: PRIVATE HEALTH INSURANCE | Source: Ambulatory Visit | Attending: Family Medicine | Admitting: Family Medicine

## 2012-06-15 DIAGNOSIS — I2699 Other pulmonary embolism without acute cor pulmonale: Principal | ICD-10-CM

## 2012-06-15 DIAGNOSIS — R7989 Other specified abnormal findings of blood chemistry: Secondary | ICD-10-CM

## 2012-06-15 DIAGNOSIS — J45909 Unspecified asthma, uncomplicated: Secondary | ICD-10-CM | POA: Diagnosis present

## 2012-06-15 DIAGNOSIS — F411 Generalized anxiety disorder: Secondary | ICD-10-CM | POA: Diagnosis present

## 2012-06-15 DIAGNOSIS — K5090 Crohn's disease, unspecified, without complications: Secondary | ICD-10-CM | POA: Diagnosis present

## 2012-06-15 DIAGNOSIS — Z79899 Other long term (current) drug therapy: Secondary | ICD-10-CM

## 2012-06-15 DIAGNOSIS — E282 Polycystic ovarian syndrome: Secondary | ICD-10-CM | POA: Diagnosis present

## 2012-06-15 DIAGNOSIS — J90 Pleural effusion, not elsewhere classified: Secondary | ICD-10-CM | POA: Diagnosis present

## 2012-06-15 DIAGNOSIS — R040 Epistaxis: Secondary | ICD-10-CM | POA: Diagnosis present

## 2012-06-15 HISTORY — DX: Other pulmonary embolism without acute cor pulmonale: I26.99

## 2012-06-15 HISTORY — DX: Anxiety disorder, unspecified: F41.9

## 2012-06-15 LAB — BASIC METABOLIC PANEL
CO2: 26 mEq/L (ref 19–32)
Chloride: 102 mEq/L (ref 96–112)
Glucose, Bld: 96 mg/dL (ref 70–99)
Potassium: 3.7 mEq/L (ref 3.5–5.1)
Sodium: 139 mEq/L (ref 135–145)

## 2012-06-15 LAB — CBC
Hemoglobin: 13.5 g/dL (ref 12.0–15.0)
MCH: 32.5 pg (ref 26.0–34.0)
RBC: 4.16 MIL/uL (ref 3.87–5.11)
WBC: 7.7 10*3/uL (ref 4.0–10.5)

## 2012-06-15 LAB — PREGNANCY, URINE: Preg Test, Ur: NEGATIVE

## 2012-06-15 LAB — PROTIME-INR
INR: 0.97 (ref 0.00–1.49)
Prothrombin Time: 13.1 seconds (ref 11.6–15.2)

## 2012-06-15 LAB — APTT: aPTT: 29 seconds (ref 24–37)

## 2012-06-15 MED ORDER — ONDANSETRON HCL 4 MG PO TABS: 4.0000 mg | ORAL_TABLET | Freq: Four times a day (QID) | ORAL | Status: DC | PRN

## 2012-06-15 MED ORDER — SODIUM CHLORIDE 0.9 % IJ SOLN
3.0000 mL | Freq: Two times a day (BID) | INTRAMUSCULAR | Status: DC
  Administered 2012-06-15: 3 mL via INTRAVENOUS

## 2012-06-15 MED ORDER — ENOXAPARIN SODIUM 80 MG/0.8ML ~~LOC~~ SOLN
1.0000 mg/kg | Freq: Two times a day (BID) | SUBCUTANEOUS | Status: DC
  Administered 2012-06-16 – 2012-06-17 (×3): 65 mg via SUBCUTANEOUS
  Filled 2012-06-15 (×5): qty 0.8

## 2012-06-15 MED ORDER — IOHEXOL 350 MG/ML SOLN
125.0000 mL | Freq: Once | INTRAVENOUS | Status: AC | PRN
  Administered 2012-06-15: 125 mL via INTRAVENOUS

## 2012-06-15 MED ORDER — ONDANSETRON HCL 4 MG/2ML IJ SOLN: 4.0000 mg | Freq: Four times a day (QID) | INTRAMUSCULAR | Status: DC | PRN

## 2012-06-15 MED ORDER — MORPHINE SULFATE 2 MG/ML IJ SOLN: 2.0000 mg | INTRAMUSCULAR | Status: DC | PRN

## 2012-06-15 MED ORDER — AZATHIOPRINE 50 MG PO TABS
100.0000 mg | ORAL_TABLET | Freq: Every day | ORAL | Status: DC
  Administered 2012-06-16 (×2): 100 mg via ORAL
  Filled 2012-06-15 (×3): qty 2

## 2012-06-15 MED ORDER — SODIUM CHLORIDE 0.9 % IJ SOLN
3.0000 mL | Freq: Two times a day (BID) | INTRAMUSCULAR | Status: DC
  Administered 2012-06-16 (×2): 3 mL via INTRAVENOUS

## 2012-06-15 MED ORDER — ENOXAPARIN SODIUM 80 MG/0.8ML ~~LOC~~ SOLN
1.0000 mg/kg | Freq: Once | SUBCUTANEOUS | Status: AC
  Administered 2012-06-15: 65 mg via SUBCUTANEOUS
  Filled 2012-06-15: qty 0.8

## 2012-06-15 MED ORDER — DOCUSATE SODIUM 100 MG PO CAPS
100.0000 mg | ORAL_CAPSULE | Freq: Two times a day (BID) | ORAL | Status: DC
  Filled 2012-06-15 (×4): qty 1

## 2012-06-15 MED ORDER — SODIUM CHLORIDE 0.9 % IJ SOLN: 3.0000 mL | INTRAMUSCULAR | Status: DC | PRN

## 2012-06-15 MED ORDER — ESCITALOPRAM OXALATE 20 MG PO TABS
20.0000 mg | ORAL_TABLET | Freq: Every day | ORAL | Status: DC
  Administered 2012-06-16 (×2): 20 mg via ORAL
  Filled 2012-06-15 (×3): qty 1

## 2012-06-15 MED ORDER — SODIUM CHLORIDE 0.9 % IV SOLN: 250.0000 mL | INTRAVENOUS | Status: DC | PRN

## 2012-06-15 NOTE — ED Provider Notes (Signed)
Patient relates she had an appendectomy done about 2 weeks ago and has been back to her normal activity. She reports over the weekend which was 4-5 days ago she had a left shoulder pain that went into her left chest and breast area that was very pleuritic in nature. She relates that 3 days ago she felt fatigued and then 2 days ago she started feeling a little bit better. She denies pain or swelling in her legs. She denies fever or coughing. She denies family history of DVT or PE.  Patient was seen by her doctor and had an outpatient CT scan showing pulmonary embolism.  Patient is alert and cooperative and in no distress.  Medical screening examination/treatment/procedure(s) were conducted as a shared visit with non-physician practitioner(s) and myself.  I personally evaluated the patient during the encounter  Devoria Albe, MD, Franz Dell, MD 06/15/12 2144

## 2012-06-15 NOTE — ED Notes (Signed)
Report received from [place], [place].  Pt transported to CDU.  No complaints at this time, pt is alert and oriented, ambulatory.

## 2012-06-15 NOTE — Telephone Encounter (Signed)
Remind me to call her tomorrow and I can tell her path results and answer these questions

## 2012-06-15 NOTE — ED Notes (Signed)
Sent over from Dr. Ephriam Knuckles office after left shoulder pain and SOB over the weekend sent to CT and confirmed PE in left lingula left lower lobe. Pt post appendectomy from July 24th. C/o left shoulder pain with inspiration.

## 2012-06-15 NOTE — ED Provider Notes (Signed)
History     CSN: [id number]  Arrival date & time 06/15/12  4098   First MD Initiated Contact with Patient 06/15/12 2009      Chief Complaint  Patient presents with  . Referral    Confirmed PE     (Consider location/radiation/quality/duration/timing/severity/associated sxs/prior treatment) HPI Comments: Caitlin Hart is a 31 y.o. Female who presents with complaint of left shoulder pain, SOB, posititive CT angio outpatient today for a left lung PE. Pt states symptoms began about 5 days ago. States pain worsened with movement, sleeping on left side, taking deep breath. States symptoms actually were improving yesterday and today. She went to see her PCP yesterday for recheck of her recent appendetomy which she had 2wks ago. Pt had a positive d dimer, and an outpatient CT angio was ordered. Pt currently denies any complaints. States minimal sob and chest pain.    Past Medical History  Diagnosis Date  . Asthma   . Crohn's disease   . PCOS (polycystic ovarian syndrome)   . PONV (postoperative nausea and vomiting)     Past Surgical History  Procedure Date  . Colonoscopy w/ biopsies   . Deviated septum repair   . Wisdom teeth removal   . Tympanoplasty   . Laparoscopic appendectomy 06/01/2012    Procedure: APPENDECTOMY LAPAROSCOPIC;  Surgeon: Emelia Loron, MD;  Location: [facility];  Service: General;  Laterality: N/A;    History reviewed. No pertinent family history.  History  Substance Use Topics  . Smoking status: Never Smoker   . Smokeless tobacco: Never Used  . Alcohol Use: Yes     occ    OB History    Grav Para Term Preterm Abortions TAB SAB Ect Mult Living                  Review of Systems  Constitutional: Negative for fever, chills and fatigue.  HENT: Negative for neck pain and neck stiffness.   Respiratory: Positive for chest tightness and shortness of breath. Negative for cough, choking and stridor.   Cardiovascular: Positive for chest pain. Negative for  palpitations and leg swelling.  Gastrointestinal: Negative for nausea, vomiting and abdominal pain.  Musculoskeletal: Negative for back pain.  Skin: Negative.   Neurological: Negative for dizziness, weakness and numbness.    Allergies  Entocort ec; Other; Penicillins; Sulfa antibiotics; Eggs or egg-derived products; and Shellfish allergy  Home Medications   Current Outpatient Rx  Name Route Sig Dispense Refill  . ACETAMINOPHEN 325 MG PO TABS Oral Take 650 mg by mouth every 6 (six) hours as needed. For shoulder pain    . ALBUTEROL SULFATE HFA 108 (90 BASE) MCG/ACT IN AERS Inhalation Inhale 2 puffs into the lungs every 4 (four) hours as needed. For shortness of breath.    . AZATHIOPRINE 50 MG PO TABS Oral Take 100 mg by mouth at bedtime.     . AZITHROMYCIN 250 MG PO TABS Oral Take 250 mg by mouth daily.    Marland Kitchen CETIRIZINE HCL 10 MG PO TABS Oral Take 10 mg by mouth daily as needed. For allergies.    Marland Kitchen ESCITALOPRAM OXALATE 20 MG PO TABS Oral Take 20 mg by mouth at bedtime.    Azzie Roup ACE-ETH ESTRAD-FE 1-20 MG-MCG PO TABS Oral Take 1 tablet by mouth daily.      BP 123/75  Pulse 84  Temp 98.1 F (36.7 C) (Oral)  Resp 16  Wt 140 lb (63.504 kg)  SpO2 97%  LMP 06/10/2012  Physical  Exam  Nursing note and vitals reviewed. Constitutional: She is oriented to person, place, and time. She appears well-developed and well-nourished.  Eyes: Conjunctivae are normal.  Neck: Neck supple.  Cardiovascular: Normal rate, regular rhythm and normal heart sounds.   Pulmonary/Chest: Effort normal and breath sounds normal. No respiratory distress. She has no wheezes. She has no rales.  Abdominal: Soft. Bowel sounds are normal. She exhibits no distension. There is no tenderness. There is no rebound.  Neurological: She is alert and oriented to person, place, and time.  Skin: Skin is warm and dry.  Psychiatric: She has a normal mood and affect.    ED Course  Procedures (including critical care  time)  Pt with confirmed PE on CT angio with posible infarction. Pt in NAD. VS normal. Ordered blood work, lovenox, will monitor. Plan to admit.    Date: 06/15/2012  Rate: 77   Rhythm: normal sinus rhythm  QRS Axis: normal  Intervals: normal  ST/T Wave abnormalities: normal  Conduction Disutrbances:none  Narrative Interpretation:   Old EKG Reviewed: none available  Results for orders placed during the hospital encounter of 06/15/12  CBC      Component Value Range   WBC 7.7  4.0 - 10.5 K/uL   RBC 4.16  3.87 - 5.11 MIL/uL   Hemoglobin 13.5  12.0 - 15.0 g/dL   HCT 47.8  29.5 - 62.1 %   MCV 95.4  78.0 - 100.0 fL   MCH 32.5  26.0 - 34.0 pg   MCHC 34.0  30.0 - 36.0 g/dL   RDW 30.8  65.7 - 84.6 %   Platelets 437 (*) 150 - 400 K/uL  BASIC METABOLIC PANEL      Component Value Range   Sodium 139  135 - 145 mEq/L   Potassium 3.7  3.5 - 5.1 mEq/L   Chloride 102  96 - 112 mEq/L   CO2 26  19 - 32 mEq/L   Glucose, Bld 96  70 - 99 mg/dL   BUN 8  6 - 23 mg/dL   Creatinine, Ser 9.62  0.50 - 1.10 mg/dL   Calcium 9.5  8.4 - 95.2 mg/dL   GFR calc non Af Amer >90  >90 mL/min   GFR calc Af Amer >90  >90 mL/min  PROTIME-INR      Component Value Range   Prothrombin Time 13.1  11.6 - 15.2 seconds   INR 0.97  0.00 - 1.49  APTT      Component Value Range   aPTT 29  24 - 37 seconds  PREGNANCY, URINE      Component Value Range   Preg Test, Ur NEGATIVE  NEGATIVE   Started on lovenox. Pt with possible infarction to the lung due to her PE. Will admit. Spoke with [facility], will come see pt.   Filed Vitals:   06/15/12 2315  BP: 122/82  Pulse: 69  Temp: 98.5 F (36.9 C)  Resp: 18     1. Pulmonary embolism       MDM          Lottie Mussel, [place] 06/16/12 [phone]

## 2012-06-16 DIAGNOSIS — E282 Polycystic ovarian syndrome: Secondary | ICD-10-CM

## 2012-06-16 DIAGNOSIS — K5090 Crohn's disease, unspecified, without complications: Secondary | ICD-10-CM

## 2012-06-16 LAB — BASIC METABOLIC PANEL
BUN: 9 mg/dL (ref 6–23)
CO2: 26 mEq/L (ref 19–32)
Chloride: 105 mEq/L (ref 96–112)
Creatinine, Ser: 0.59 mg/dL (ref 0.50–1.10)
GFR calc Af Amer: >90 mL/min (ref >90)
Potassium: 4.1 mEq/L (ref 3.5–5.1)

## 2012-06-16 LAB — CBC
HCT: 37.8 % (ref 36.0–46.0)
Hemoglobin: 12.6 g/dL (ref 12.0–15.0)
MCHC: 33.3 g/dL (ref 30.0–36.0)
MCV: 95.9 fL (ref 78.0–100.0)
RDW: 13.4 % (ref 11.5–15.5)
WBC: 5.9 10*3/uL (ref 4.0–10.5)

## 2012-06-16 LAB — PROTIME-INR: INR: 1.04 (ref 0.00–1.49)

## 2012-06-16 MED ORDER — ACETAMINOPHEN 325 MG PO TABS
650.0000 mg | ORAL_TABLET | Freq: Four times a day (QID) | ORAL | Status: DC | PRN
  Administered 2012-06-16 (×2): 650 mg via ORAL
  Filled 2012-06-16 (×2): qty 2

## 2012-06-16 MED ORDER — WARFARIN VIDEO: 1.0000 | Freq: Once | Status: DC

## 2012-06-16 MED ORDER — COUMADIN BOOK
1.0000 | Freq: Once | Status: AC
  Administered 2012-06-16: 1
  Filled 2012-06-16: qty 1

## 2012-06-16 MED ORDER — WARFARIN SODIUM 7.5 MG PO TABS
7.5000 mg | ORAL_TABLET | Freq: Once | ORAL | Status: AC
  Administered 2012-06-16: 7.5 mg via ORAL
  Filled 2012-06-16: qty 1

## 2012-06-16 MED ORDER — WARFARIN - PHARMACIST DOSING INPATIENT: Freq: Every day | Status: DC

## 2012-06-16 MED ORDER — WARFARIN SODIUM 7.5 MG PO TABS
7.5000 mg | ORAL_TABLET | ORAL | Status: AC
  Administered 2012-06-16: 7.5 mg via ORAL
  Filled 2012-06-16: qty 1

## 2012-06-16 NOTE — Progress Notes (Signed)
ANTICOAGULATION CONSULT NOTE - Initial Consult  Pharmacy Consult for warfarin Indication: pulmonary embolus  Allergies  Allergen Reactions  . Entocort Ec (Budesonide) Hives  . Eggs Or Egg-Derived Products Hives and Other (See Comments)    "I get dizzy"  . Other Other (See Comments)    treenuts-blisters in mouth and throat  . Penicillins Hives  . Sulfa Antibiotics Nausea And Vomiting  . Shellfish Allergy Other (See Comments)    "not sure if I'm allergic to these or not"    Patient Measurements: Height: 5' 3.5" (161.3 cm) Weight: 140 lb 1.6 oz (63.549 kg) IBW/kg (Calculated) : 53.55   Vital Signs: Temp: 98.5 F (36.9 C) (08/07 2315) Temp src: Oral (08/07 2315) BP: 122/82 mmHg (08/07 2315) Pulse Rate: 69  (08/07 2315)  Labs:  Basename 06/15/12 2026  HGB 13.5  HCT 39.7  PLT 437*  APTT 29  LABPROT 13.1  INR 0.97  HEPARINUNFRC --  CREATININE 0.63  CKTOTAL --  CKMB --  TROPONINI --    Estimated Creatinine Clearance: 85.4 ml/min (by C-G formula based on Cr of 0.63).   Medical History: Past Medical History  Diagnosis Date  . Crohn's disease   . PCOS (polycystic ovarian syndrome)   . PONV (postoperative nausea and vomiting)   . Pulmonary embolism on left 06/15/2012  . Asthma   . Ulcerative colitis with rectal bleeding   . Anxiety     Medications:  Scheduled:    . azaTHIOprine  100 mg Oral QHS  . docusate sodium  100 mg Oral BID  . enoxaparin  1 mg/kg Subcutaneous Once  . enoxaparin (LOVENOX) injection  1 mg/kg Subcutaneous Q12H  . escitalopram  20 mg Oral QHS  . sodium chloride  3 mL Intravenous Q12H  . sodium chloride  3 mL Intravenous Q12H    Assessment: 31 yo female admitted with new pulmonary embolism. Pharmacy consulted to manage warfarin. Patient also to receive Lovenox 1mg /kg Q12H per physician order. Baseline INR 0.97.   Goal of Therapy:  INR 2-3 Monitor platelets by anticoagulation protocol: Yes   Plan:  1. Coumadin 7.5mg  po now.  2.  Daily PT / INR. 3. Coumadin book / video  Emeline Gins 06/16/2012,12:05 AM

## 2012-06-16 NOTE — H&P (Signed)
[facility] History and Physical  Caitlin Hart JXB:[id number] DOB: [birth year]    PCP:   Gretel Acre, MD   Chief Complaint:  Acute pulmonary embolism.  HPI: Caitlin Hart is an 31 y.o. female with hx of PCOS on OBCP ( to avoid recurrent ovarian cyst), crohn's disease on Imuran, anxiety, asthma, s/p lap appendectomy late July 2013, presents to her primary care with mild chest pain and sob.  She had a positive D dimer there, resulting in a CTPA positive for PE with low clot burden in the left lung with a small left pleural effusion and possible lingular infarct.  She has normal hemodynamics, with Hb 13.5 g/DL and platelet count of 130Q.  She has no Hx of GI bleed.  She was given Lovenox and hospitalist was asked to admit her for further tx of her acute pulmonary embolism.  Rewiew of Systems:  Constitutional: Negative for malaise, fever and chills. No significant weight loss or weight gain Eyes: Negative for eye pain, redness and discharge, diplopia, visual changes, or flashes of light. ENMT: Negative for ear pain, hoarseness, nasal congestion, sinus pressure and sore throat. No headaches; tinnitus, drooling, or problem swallowing. Cardiovascular: Negative for diaphoresis, dyspnea and peripheral edema. ; No orthopnea, PND Respiratory: Negative for cough, hemoptysis, wheezing and stridor. No pleuritic chestpain. Gastrointestinal: Negative for nausea, vomiting, diarrhea, constipation, abdominal pain, melena, blood in stool, hematemesis, jaundice and rectal bleeding.    Genitourinary: Negative for frequency, dysuria, incontinence,flank pain and hematuria; Musculoskeletal: Negative for back pain and neck pain. Negative for swelling and trauma.;  Skin: . Negative for pruritus, rash, abrasions, bruising and skin lesion.; ulcerations Neuro: Negative for headache, lightheadedness and neck stiffness. Negative for weakness, altered level of consciousness , altered mental status, extremity  weakness, burning feet, involuntary movement, seizure and syncope.  Psych: negative for depression, insomnia, tearfulness, panic attacks, hallucinations, paranoia, suicidal or homicidal ideation   Past Medical History  Diagnosis Date  . Crohn's disease   . PCOS (polycystic ovarian syndrome)   . PONV (postoperative nausea and vomiting)   . Pulmonary embolism on left 06/15/2012  . Asthma   . Ulcerative colitis with rectal bleeding   . Anxiety     Past Surgical History  Procedure Date  . Colonoscopy w/ biopsies 05/2010  . Tympanoplasty 1991    left  . Laparoscopic appendectomy 06/01/2012    Procedure: APPENDECTOMY LAPAROSCOPIC;  Surgeon: Emelia Loron, MD;  Location: [facility];  Service: General;  Laterality: N/A;  . Nasal septum surgery 2009  . Appendectomy   . Wisdom tooth extraction 1999    "all 4"    Medications:  HOME MEDS: Prior to Admission medications   Medication Sig Start Date End Date Taking? Authorizing Provider  acetaminophen (TYLENOL) 325 MG tablet Take 650 mg by mouth every 6 (six) hours as needed. For shoulder pain   Yes Historical Provider, MD  albuterol (PROVENTIL HFA;VENTOLIN HFA) 108 (90 BASE) MCG/ACT inhaler Inhale 2 puffs into the lungs every 4 (four) hours as needed. For shortness of breath.   Yes Historical Provider, MD  azaTHIOprine (IMURAN) 50 MG tablet Take 100 mg by mouth at bedtime.    Yes Historical Provider, MD  azithromycin (ZITHROMAX) 250 MG tablet Take 250 mg by mouth daily. 06/14/12 06/19/12 Yes Historical Provider, MD  cetirizine (ZYRTEC) 10 MG tablet Take 10 mg by mouth daily as needed. For allergies.   Yes Historical Provider, MD  escitalopram (LEXAPRO) 20 MG tablet Take 20 mg by mouth at bedtime.  Yes Historical Provider, MD  norethindrone-ethinyl estradiol (JUNEL FE,GILDESS FE,LOESTRIN FE) 1-20 MG-MCG tablet Take 1 tablet by mouth daily.   Yes Historical Provider, MD     Allergies:  Allergies  Allergen Reactions  . Entocort Ec (Budesonide)  Hives  . Eggs Or Egg-Derived Products Hives and Other (See Comments)    "I get dizzy"  . Other Other (See Comments)    treenuts-blisters in mouth and throat  . Penicillins Hives  . Sulfa Antibiotics Nausea And Vomiting  . Shellfish Allergy Other (See Comments)    "not sure if I'm allergic to these or not"    Social History:   reports that she has never smoked. She has never used smokeless tobacco. She reports that she drinks alcohol. She reports that she does not use illicit drugs.  Family History: History reviewed. No pertinent family history.   Physical Exam: Filed Vitals:   06/15/12 1905 06/15/12 2136 06/15/12 2200 06/15/12 2315  BP: 123/75 113/70 107/77 122/82  Pulse: 84 84 70 69  Temp: 98.1 F (36.7 C)   98.5 F (36.9 C)  TempSrc: Oral   Oral  Resp: 16 16 14 18   Height:    5' 3.5" (1.613 m)  Weight: 63.504 kg (140 lb)   63.549 kg (140 lb 1.6 oz)  SpO2: 97% 99% 99% 96%   Blood pressure 122/82, pulse 69, temperature 98.5 F (36.9 C), temperature source Oral, resp. rate 18, height 5' 3.5" (1.613 m), weight 63.549 kg (140 lb 1.6 oz), last menstrual period 06/10/2012, SpO2 96.00%.  GEN:  Pleasant patient lying in the stretcher in no acute distress; cooperative with exam. PSYCH:  alert and oriented x4; does not appear anxious or depressed; affect is appropriate. HEENT: Mucous membranes pink and anicteric; PERRLA; EOM intact; no cervical lymphadenopathy nor thyromegaly or carotid bruit; no JVD; There were no stridor. Neck is very supple. Breasts:: Not examined CHEST WALL: No tenderness CHEST: Normal respiration, clear to auscultation bilaterally.  HEART: Regular rate and rhythm.  There are no murmur, rub, or gallops.   BACK: No kyphosis or scoliosis; no CVA tenderness ABDOMEN: soft and non-tender; no masses, no organomegaly, normal abdominal bowel sounds; no pannus; no intertriginous candida. There is no rebound and no distention. Rectal Exam: Not done EXTREMITIES: No bone  or joint deformity; age-appropriate arthropathy of the hands and knees; no edema; no ulcerations.  There is no calf tenderness. Genitalia: not examined PULSES: 2+ and symmetric SKIN: Normal hydration no rash or ulceration CNS: Cranial nerves 2-12 grossly intact no focal lateralizing neurologic deficit.  Speech is fluent; uvula elevated with phonation, facial symmetry and tongue midline. DTR are normal bilaterally, cerebella exam is intact, barbinski is negative and strengths are equaled bilaterally.  No sensory loss.   Labs on Admission:  Basic Metabolic Panel:  Lab 06/15/12 1610  NA 139  K 3.7  CL 102  CO2 26  GLUCOSE 96  BUN 8  CREATININE 0.63  CALCIUM 9.5  MG --  PHOS --   Liver Function Tests: No results found for this basename: AST:5,ALT:5,ALKPHOS:5,BILITOT:5,PROT:5,ALBUMIN:5 in the last 168 hours No results found for this basename: LIPASE:5,AMYLASE:5 in the last 168 hours No results found for this basename: AMMONIA:5 in the last 168 hours CBC:  Lab 06/15/12 2026  WBC 7.7  NEUTROABS --  HGB 13.5  HCT 39.7  MCV 95.4  PLT 437*   Cardiac Enzymes: No results found for this basename: CKTOTAL:5,CKMB:5,CKMBINDEX:5,TROPONINI:5 in the last 168 hours  CBG: No results found for this  basename: GLUCAP:5 in the last 168 hours   Radiological Exams on Admission: Ct Angio Chest Pe W/cm &/or Wo Cm  06/15/2012  **ADDENDUM** CREATED: 06/15/2012 18:39:56  Findings conveyed in to Dr. Luciana Axe on 06/15/2012 at 6:41  **END ADDENDUM** SIGNED BY: Genevive Bi, M.D.   06/15/2012  *RADIOLOGY REPORT*  Clinical Data: Left pleural effusion.  Elevated D-dimer after appendectomy.  CT ANGIOGRAPHY CHEST  Technique:  Multidetector CT imaging of the chest using the standard protocol during bolus administration of intravenous contrast. Multiplanar reconstructed images including MIPs were obtained and reviewed to evaluate the vascular anatomy.  Contrast: OMNIPAQUE IOHEXOL 350 MG/ML SOLN  Comparison:  Plain film 08/06 1013  Findings: There is a tubular filling defect which extends into the proximal branch of the lingula pulmonary artery as well as into the proximal segmental branches of the left lower lobe (image 45).  No evidence of emboli within the right lung  There is a small left pleural effusion.  A focus of atelectasis contains central fat within the anterior medial lingula which may represent infarction.  Heart appears normal.  Great vessels are normal.  No mediastinal or hilar adenopathy.  Limited view upper abdomen is unremarkable.  IMPRESSION:  1. Acute  pulmonary thromboembolism within the lingula and left lower lobe.  Overall clot burden is minimal. 2.  New left pleural effusion. 3.  Potential  pulmonary infarction in the lingula.  A voice message was left on referring physicians on call service on 06/15/2012 at 6:21 p.m. Original Report Authenticated By: Genevive Bi, M.D.     Assessment/Plan Present on Admission:  .Crohn's disease .PCOS (polycystic ovarian syndrome) Acute Pulmonary embolism anxiety   PLAN:  Her pulmonary embolism is clearly "provoked" with recent abdominal surgery and on oral contraceptives.  She was given Lovenox and I will add coumadin.  Risks and benefits of anticoagulation explained and patient expressed understanding, appreciation, and acceptance.  Thrombophilic work up is not indicated.  I will discontinue her OBCP and it probably should not be restarted.  She is stable, full code, and will be admitted to telemetry under [facility] service.  Other plans as per orders.  Code Status: Josiah Lobo, MD. [facility] Pager [phone] 7pm to 7am.  06/16/2012, 12:16 AM

## 2012-06-16 NOTE — Progress Notes (Addendum)
[facility] PROGRESS NOTE  Caitlin Hart ZOX:[id number] DOB: 1981-03-13 DOA: 06/15/2012 PCP: Gretel Acre, MD  Assessment/Plan: Principal Problem:  *Pulmonary embolism Active Problems:  Crohn's disease  PCOS (polycystic ovarian syndrome)  1. pulm emboli- on BCP (not sure when started), lovenox day 2/5-- bridging to coumadin--- hope for d/c tomm, if patient has been on BCP for a while may need to consider work up of blood clot once off coumadin- recent appendectomy, echo pending 2. Crohn's - follows with Dr. Loreta Ave- continue current treatment  Code Status: full Family Communication: patient at bedside Disposition Plan: home on lovenox    HPI/Subjective: Feeling better- had 1 episode of rectal bleeding-- just had another BM that -- left sided CP     Objective: Filed Vitals:   06/15/12 2136 06/15/12 2200 06/15/12 2315 06/16/12 0444  BP: 113/70 107/77 122/82 113/75  Pulse: 84 70 69 78  Temp:   98.5 F (36.9 C) 97.1 F (36.2 C)  TempSrc:   Oral Oral  Resp: 16 14 18 18   Height:   5' 3.5" (1.613 m)   Weight:   63.549 kg (140 lb 1.6 oz)   SpO2: 99% 99% 96% 96%    Intake/Output Summary (Last 24 hours) at 06/16/12 1252 Last data filed at 06/16/12 0700  Gross per 24 hour  Intake    240 ml  Output      0 ml  Net    240 ml    Exam:   General:  A+Ox3, NAD  Cardiovascular: rrr  Respiratory: clear B/L  Abdomen: +BS, soft, NT  Data Reviewed: Basic Metabolic Panel:  Lab 06/16/12 8119 06/15/12 2026  NA 141 139  K 4.1 3.7  CL 105 102  CO2 26 26  GLUCOSE 104* 96  BUN 9 8  CREATININE 0.59 0.63  CALCIUM 9.0 9.5  MG -- --  PHOS -- --   Liver Function Tests: No results found for this basename: AST:5,ALT:5,ALKPHOS:5,BILITOT:5,PROT:5,ALBUMIN:5 in the last 168 hours No results found for this basename: LIPASE:5,AMYLASE:5 in the last 168 hours No results found for this basename: AMMONIA:5 in the last 168 hours CBC:  Lab 06/16/12 0640 06/15/12 2026  WBC 5.9 7.7   NEUTROABS -- --  HGB 12.6 13.5  HCT 37.8 39.7  MCV 95.9 95.4  PLT 406* 437*   Cardiac Enzymes: No results found for this basename: CKTOTAL:5,CKMB:5,CKMBINDEX:5,TROPONINI:5 in the last 168 hours BNP (last 3 results) No results found for this basename: PROBNP:3 in the last 8760 hours CBG: No results found for this basename: GLUCAP:5 in the last 168 hours  No results found for this or any previous visit (from the past 240 hour(s)).   Studies: Ct Angio Chest Pe W/cm &/or Wo Cm  06/15/2012  **ADDENDUM** CREATED: 06/15/2012 18:39:56  Findings conveyed in to Dr. Luciana Axe on 06/15/2012 at 6:41  **END ADDENDUM** SIGNED BY: Genevive Bi, M.D.   06/15/2012  *RADIOLOGY REPORT*  Clinical Data: Left pleural effusion.  Elevated D-dimer after appendectomy.  CT ANGIOGRAPHY CHEST  Technique:  Multidetector CT imaging of the chest using the standard protocol during bolus administration of intravenous contrast. Multiplanar reconstructed images including MIPs were obtained and reviewed to evaluate the vascular anatomy.  Contrast: OMNIPAQUE IOHEXOL 350 MG/ML SOLN  Comparison: Plain film 08/06 1013  Findings: There is a tubular filling defect which extends into the proximal branch of the lingula pulmonary artery as well as into the proximal segmental branches of the left lower lobe (image 45).  No evidence of emboli within  the right lung  There is a small left pleural effusion.  A focus of atelectasis contains central fat within the anterior medial lingula which may represent infarction.  Heart appears normal.  Great vessels are normal.  No mediastinal or hilar adenopathy.  Limited view upper abdomen is unremarkable.  IMPRESSION:  1. Acute  pulmonary thromboembolism within the lingula and left lower lobe.  Overall clot burden is minimal. 2.  New left pleural effusion. 3.  Potential  pulmonary infarction in the lingula.  A voice message was left on referring physicians on call service on 06/15/2012 at 6:21 p.m.  Original Report Authenticated By: Genevive Bi, M.D.   Ct Abdomen Pelvis W Contrast  06/01/2012  *RADIOLOGY REPORT*  Clinical Data: Abdominal pain.  Recent prednisone treatment for Crohn's disease.  CT ABDOMEN AND PELVIS WITH CONTRAST  Technique:  Multidetector CT imaging of the abdomen and pelvis was performed following the standard protocol during bolus administration of intravenous contrast.  Contrast: 30mL OMNIPAQUE IOHEXOL 300 MG/ML  SOLN, OMNIPAQUE IOHEXOL 300 MG/ML  SOLN  Comparison: 05/26/2010 CT abdomen pelvis  Findings: Clear lung bases.  Normal liver, spleen, pancreas, kidneys, adrenal gland, and gallbladder.  Normal aorta and IVC. Normal stomach and small bowel.  In the right lower quadrant, there is a tubular structure overlying the psoas just below the ileocecal junction. This tubular structure measures 9 mm in cross section with slight surrounding inflammation.  On prior study this was the location of the appendix, and concern is raised for acute appendicitis.  There is mild edema of the medial cecum (images 48 - 52, series 2) which could represent a reaction to adjacent appendiceal inflammation or represent a manifestation of Crohn's disease.  There is no bowel obstruction. There is no small bowel wall thickening. Slight free fluid is noted in the right pelvis (image 60 series 2). More distally in the colon, there is moderate wall thickening of the distal sigmoid extending to the rectum. This area was abnormal previously.  Rectosigmoid Crohn's involvement is not excluded. Moderately large 35 x 38 mm left ovarian cyst.  Bladder unremarkable.  No signs of spondyloarthropathy.  Anatomic alignment lumbar spine.  IMPRESSION: Right lower quadrant tubular structure overlying the psoas could represent acute appendicitis.  Small amount free fluid right lower pelvis.  Moderate wall thickening distal sigmoid and proximal rectal region was site of previous inflammation, and is consistent with  rectosigmoid Crohn's disease.  Original Report Authenticated By: Elsie Stain, M.D.    Scheduled Meds:   . azaTHIOprine  100 mg Oral QHS  . coumadin book  1 each Does not apply Once  . docusate sodium  100 mg Oral BID  . enoxaparin  1 mg/kg Subcutaneous Once  . enoxaparin (LOVENOX) injection  1 mg/kg Subcutaneous Q12H  . escitalopram  20 mg Oral QHS  . sodium chloride  3 mL Intravenous Q12H  . sodium chloride  3 mL Intravenous Q12H  . warfarin  7.5 mg Oral NOW  . warfarin  7.5 mg Oral ONCE-1800  . warfarin  1 each Does not apply Once  . Warfarin - Pharmacist Dosing Inpatient   Does not apply q1800   Continuous Infusions:   Principal Problem:  *Pulmonary embolism Active Problems:  Crohn's disease  PCOS (polycystic ovarian syndrome)    Time spent: 25    Marlin Canary  [facility] Pager [phone]  06/16/2012, 12:52 PM  LOS: 1 day

## 2012-06-16 NOTE — Progress Notes (Signed)
ANTICOAGULATION CONSULT NOTE - Follow Up Consult  Pharmacy Consult for Coumadin Indication: Acute PE  Allergies  Allergen Reactions  . Entocort Ec (Budesonide) Hives  . Eggs Or Egg-Derived Products Hives and Other (See Comments)    "I get dizzy"  . Other Other (See Comments)    treenuts-blisters in mouth and throat  . Penicillins Hives  . Sulfa Antibiotics Nausea And Vomiting  . Shellfish Allergy Other (See Comments)    "not sure if I'm allergic to these or not"    Patient Measurements: Height: 5' 3.5" (161.3 cm) Weight: 140 lb 1.6 oz (63.549 kg) IBW/kg (Calculated) : 53.55  Heparin Dosing Weight:   Vital Signs: Temp: 97.1 F (36.2 C) (08/08 0444) Temp src: Oral (08/08 0444) BP: 113/75 mmHg (08/08 0444) Pulse Rate: 78  (08/08 0444)  Labs:  Basename 06/16/12 0640 06/15/12 2026  HGB 12.6 13.5  HCT 37.8 39.7  PLT 406* 437*  APTT -- 29  LABPROT 13.8 13.1  INR 1.04 0.97  HEPARINUNFRC -- --  CREATININE 0.59 0.63  CKTOTAL -- --  CKMB -- --  TROPONINI -- --    Estimated Creatinine Clearance: 85.4 ml/min (by C-G formula based on Cr of 0.59).   Medications:  Lovenox 65mg  SQ q12h  Assessment: 31yof on Lovenox bridging to Coumadin (Day 2 of minimum 5 Day overlap) for CTA confirmed PE. INR (1.04) is subtherapeutic as expected after Coumadin 7.5mg  x 1 - will repeat dose.  - Wt: 63.5kg, CrCl: 85 ml/min - AST/ALT wnl - H/H and Plts wnl but trended down - RN reported small amount of rectal bleeding this AM - monitor  Goal of Therapy:  INR 2-3   Plan:  1. Repeat Coumadin 7.5mg  po x 1 today 2. Continue Lovenox 65mg  SQ q12h - dose appropriate for renal function 3. Follow-up AM INR and monitor for s/s bleeding  Cleon Dew [phone] 06/16/2012,9:10 AM

## 2012-06-16 NOTE — Care Management Note (Unsigned)
    Page 1 of 1   06/16/2012     1:25:02 PM   CARE MANAGEMENT NOTE 06/16/2012  Patient:  Caitlin Hart, Caitlin Hart   Account Number:  [id number]  Date Initiated:  06/16/2012  Documentation initiated by:  SIMMONS,Ovella Manygoats  Subjective/Objective Assessment:   ADMITTED WITH PE; LIVES AT HOME WITH PARTNER; WAS IPTA; USES [facility] ON [address] FOR RX.     Action/Plan:   DISCHARGE PLANNING DISCUSSED AT BEDSIDE.   Anticipated DC Date:  06/17/2012   Anticipated DC Plan:  HOME/SELF CARE      DC Planning Services  CM consult      Choice offered to / List presented to:             Status of service:  In process, will continue to follow Medicare Important Message given?   (If response is "NO", the following Medicare IM given date fields will be blank) Date Medicare IM given:   Date Additional Medicare IM given:    Discharge Disposition:    Per UR Regulation:  Reviewed for med. necessity/level of care/duration of stay  If discussed at Long Length of Stay Meetings, dates discussed:    Comments:  06/16/12  1324  Bernese Doffing SIMMONS RN, BSN [phone] INFORMATION PROVIDED TO PT:  generic medications are $25.00  name brand are $50.00  name brand specialty meds are $100.00    06/16/12  1011  Mccormick Macon SIMMONS RN, BSN [phone] BENEFITS CHECK IN PROGRESS FOR LOVENOX PRICING; NCM WILL FOLLOW.

## 2012-06-17 DIAGNOSIS — I2699 Other pulmonary embolism without acute cor pulmonale: Secondary | ICD-10-CM

## 2012-06-17 MED ORDER — WARFARIN SODIUM 7.5 MG PO TABS
7.5000 mg | ORAL_TABLET | Freq: Once | ORAL | Status: DC
  Filled 2012-06-17: qty 1

## 2012-06-17 MED ORDER — ENOXAPARIN (LOVENOX) PATIENT EDUCATION KIT
PACK | Freq: Once | Status: AC
  Administered 2012-06-17: 09:00:00
  Filled 2012-06-17: qty 1

## 2012-06-17 MED ORDER — WARFARIN SODIUM 7.5 MG PO TABS: 7.5000 mg | ORAL_TABLET | Freq: Once | ORAL | Status: DC

## 2012-06-17 MED ORDER — ENOXAPARIN SODIUM 80 MG/0.8ML ~~LOC~~ SOLN: 1.0000 mg/kg | Freq: Two times a day (BID) | SUBCUTANEOUS | Status: DC

## 2012-06-17 NOTE — ED Provider Notes (Signed)
See prior note   Leane Loring L Jachin Coury, MD 06/17/12 1622 

## 2012-06-17 NOTE — Progress Notes (Signed)
ANTICOAGULATION CONSULT NOTE - Follow Up Consult  Pharmacy Consult for Lovenox and Coumadin Indication: pulmonary embolus  Allergies  Allergen Reactions  . Entocort Ec (Budesonide) Hives  . Eggs Or Egg-Derived Products Hives and Other (See Comments)    "I get dizzy"  . Other Other (See Comments)    treenuts-blisters in mouth and throat  . Penicillins Hives  . Sulfa Antibiotics Nausea And Vomiting  . Shellfish Allergy Other (See Comments)    "not sure if I'm allergic to these or not"    Patient Measurements: Height: 5' 3.5" (161.3 cm) Weight: 140 lb 1.6 oz (63.549 kg) IBW/kg (Calculated) : 53.55   Vital Signs: Temp: 97.8 F (36.6 C) (08/09 0424) Temp src: Oral (08/09 0424) BP: 120/75 mmHg (08/09 0424) Pulse Rate: 77  (08/09 0424)  Labs:  Basename 06/17/12 9629 06/16/12 0640 06/15/12 2026  HGB -- 12.6 13.5  HCT -- 37.8 39.7  PLT -- 406* 437*  APTT -- -- 29  LABPROT 16.1* 13.8 13.1  INR 1.26 1.04 0.97  HEPARINUNFRC -- -- --  CREATININE -- 0.59 0.63  CKTOTAL -- -- --  CKMB -- -- --  TROPONINI -- -- --    Estimated Creatinine Clearance: 85.4 ml/min (by C-G formula based on Cr of 0.59).   Medications:  Lovenox 65kg SQ q12h  Assessment: 31yof on Lovenox bridging to Coumadin (Day 3 of minimum 5 Day overlap) for CTA confirmed PE. INR (1.26) is subtherapeutic but is beginning to trend up appropriately - will continue with 7.5mg .  - No CBC today - No significant bleeding reported  Goal of Therapy:  INR 2-3 Anti-Xa level 0.6-1.2 units/ml 4hrs after LMWH dose given Monitor platelets by anticoagulation protocol: Yes   Plan:  1. Repeat Coumadin 7.5mg  po x 1 today 2. Continue Lovenox 65mg  SQ q12 3. Follow-up AM INR and discharge plans  Cleon Dew [phone] 06/17/2012,9:34 AM

## 2012-06-17 NOTE — Discharge Summary (Signed)
Physician Discharge Summary  Caitlin Hart ZOX:[id number] DOB: Jan 21, 1981 DOA: 06/15/2012  PCP: Gretel Acre, MD  Admit date: 06/15/2012 Discharge date: 06/17/2012  Recommendations for Outpatient Follow-up:  Lovenox til at least Monday with PT/INR check on Monday Hypercoagulable work up after coumadin treatment   Discharge Diagnoses:  Principal Problem:  *Pulmonary embolism Active Problems:  Crohn's disease  PCOS (polycystic ovarian syndrome)   Discharge Condition: improved  Diet recommendation: warfarin  Wt Readings from Last 3 Encounters:  06/15/12 63.549 kg (140 lb 1.6 oz)  06/01/12 62.559 kg (137 lb 14.7 oz)  06/01/12 62.559 kg (137 lb 14.7 oz)    History of present illness:  Caitlin Hart is an 31 y.o. female with hx of PCOS on OBCP ( to avoid recurrent ovarian cyst), crohn's disease on Imuran, anxiety, asthma, s/p lap appendectomy late July 2013, presents to her primary care with mild chest pain and sob. She had a positive D dimer there, resulting in a CTPA positive for PE with low clot burden in the left lung with a small left pleural effusion and possible lingular infarct. She has normal hemodynamics, with Hb 13.5 g/DL and platelet count of 811B. She has no Hx of GI bleed. She was given Lovenox and hospitalist was asked to admit her for further tx of her acute pulmonary embolism.   Hospital Course:  1. pulm emboli- on BCP for last few years, lovenox day 3/5-- bridging to coumadin--- hope for d/c tomm, if patient has been on BCP for a while, consider work up of blood clot once off coumadin- recent appendectomy, echo showed no heart strain 2. Crohn's - follows with Dr. Loreta Ave- continue current treatment 3. Frequent nose bleed- if happens on coumadin may need to see ENT- return to ER if unable to stop bleeding   Discharge Exam: Filed Vitals:   06/17/12 0424  BP: 120/75  Pulse: 77  Temp: 97.8 F (36.6 C)  Resp: 18   Filed Vitals:   06/16/12 1433 06/16/12 2041 06/16/12  2114 06/17/12 0424  BP: 119/76 115/69 106/68 120/75  Pulse: 79 74 64 77  Temp: 97.9 F (36.6 C) 97.4 F (36.3 C) 98.5 F (36.9 C) 97.8 F (36.6 C)  TempSrc: Oral Oral Oral Oral  Resp: 18 18 18 18   Height:      Weight:      SpO2: 98% 99% 97% 98%    General: A+Ox3, NAD Cardiovascular: rrr Respiratory: clear anterior  Discharge Instructions  Discharge Orders    Future Appointments: Provider: Department: Dept Phone: Center:   07/01/2012 11:20 AM Emelia Loron, MD [facility] [phone] None     Future Orders Please Complete By Expires   Diet general      Increase activity slowly      Discharge instructions      Comments:   Dr appt Monday to get INR drawn     Medication List  As of 06/17/2012  1:03 PM   STOP taking these medications         norethindrone-ethinyl estradiol 1-20 MG-MCG tablet         TAKE these medications         acetaminophen 325 MG tablet   Commonly known as: TYLENOL   Take 650 mg by mouth every 6 (six) hours as needed. For shoulder pain      albuterol 108 (90 BASE) MCG/ACT inhaler   Commonly known as: PROVENTIL HFA;VENTOLIN HFA   Inhale 2 puffs into the lungs every 4 (four) hours as needed. For  shortness of breath.      azaTHIOprine 50 MG tablet   Commonly known as: IMURAN   Take 100 mg by mouth at bedtime.      azithromycin 250 MG tablet   Commonly known as: ZITHROMAX   Take 250 mg by mouth daily.      cetirizine 10 MG tablet   Commonly known as: ZYRTEC   Take 10 mg by mouth daily as needed. For allergies.      escitalopram 20 MG tablet   Commonly known as: LEXAPRO   Take 20 mg by mouth at bedtime.      warfarin 7.5 MG tablet   Commonly known as: COUMADIN   Take 1 tablet (7.5 mg total) by mouth one time only at 6 PM.           Follow-up Information    Follow up with NNODI, ADAKU, MD. (Monday at 12 PM)    Contact information:   [address] [place] [address] [phone]           The  results of significant diagnostics from this hospitalization (including imaging, microbiology, ancillary and laboratory) are listed below for reference.    Significant Diagnostic Studies: Ct Angio Chest Pe W/cm &/or Wo Cm  06/15/2012  **ADDENDUM** CREATED: 06/15/2012 18:39:56  Findings conveyed in to Dr. Luciana Axe on 06/15/2012 at 6:41  **END ADDENDUM** SIGNED BY: Genevive Bi, M.D.   06/15/2012  *RADIOLOGY REPORT*  Clinical Data: Left pleural effusion.  Elevated D-dimer after appendectomy.  CT ANGIOGRAPHY CHEST  Technique:  Multidetector CT imaging of the chest using the standard protocol during bolus administration of intravenous contrast. Multiplanar reconstructed images including MIPs were obtained and reviewed to evaluate the vascular anatomy.  Contrast: OMNIPAQUE IOHEXOL 350 MG/ML SOLN  Comparison: Plain film 08/06 1013  Findings: There is a tubular filling defect which extends into the proximal branch of the lingula pulmonary artery as well as into the proximal segmental branches of the left lower lobe (image 45).  No evidence of emboli within the right lung  There is a small left pleural effusion.  A focus of atelectasis contains central fat within the anterior medial lingula which may represent infarction.  Heart appears normal.  Great vessels are normal.  No mediastinal or hilar adenopathy.  Limited view upper abdomen is unremarkable.  IMPRESSION:  1. Acute  pulmonary thromboembolism within the lingula and left lower lobe.  Overall clot burden is minimal. 2.  New left pleural effusion. 3.  Potential  pulmonary infarction in the lingula.  A voice message was left on referring physicians on call service on 06/15/2012 at 6:21 p.m. Original Report Authenticated By: Genevive Bi, M.D.   Ct Abdomen Pelvis W Contrast  06/01/2012  *RADIOLOGY REPORT*  Clinical Data: Abdominal pain.  Recent prednisone treatment for Crohn's disease.  CT ABDOMEN AND PELVIS WITH CONTRAST  Technique:  Multidetector CT  imaging of the abdomen and pelvis was performed following the standard protocol during bolus administration of intravenous contrast.  Contrast: 30mL OMNIPAQUE IOHEXOL 300 MG/ML  SOLN, OMNIPAQUE IOHEXOL 300 MG/ML  SOLN  Comparison: 05/26/2010 CT abdomen pelvis  Findings: Clear lung bases.  Normal liver, spleen, pancreas, kidneys, adrenal gland, and gallbladder.  Normal aorta and IVC. Normal stomach and small bowel.  In the right lower quadrant, there is a tubular structure overlying the psoas just below the ileocecal junction. This tubular structure measures 9 mm in cross section with slight surrounding inflammation.  On prior study  this was the location of the appendix, and concern is raised for acute appendicitis.  There is mild edema of the medial cecum (images 48 - 52, series 2) which could represent a reaction to adjacent appendiceal inflammation or represent a manifestation of Crohn's disease.  There is no bowel obstruction. There is no small bowel wall thickening. Slight free fluid is noted in the right pelvis (image 60 series 2). More distally in the colon, there is moderate wall thickening of the distal sigmoid extending to the rectum. This area was abnormal previously.  Rectosigmoid Crohn's involvement is not excluded. Moderately large 35 x 38 mm left ovarian cyst.  Bladder unremarkable.  No signs of spondyloarthropathy.  Anatomic alignment lumbar spine.  IMPRESSION: Right lower quadrant tubular structure overlying the psoas could represent acute appendicitis.  Small amount free fluid right lower pelvis.  Moderate wall thickening distal sigmoid and proximal rectal region was site of previous inflammation, and is consistent with rectosigmoid Crohn's disease.  Original Report Authenticated By: Elsie Stain, M.D.    Microbiology: No results found for this or any previous visit (from the past 240 hour(s)).   Labs: Basic Metabolic Panel:  Lab 06/16/12 1610 06/15/12 2026  NA 141 139  K 4.1 3.7    CL 105 102  CO2 26 26  GLUCOSE 104* 96  BUN 9 8  CREATININE 0.59 0.63  CALCIUM 9.0 9.5  MG -- --  PHOS -- --   Liver Function Tests: No results found for this basename: AST:5,ALT:5,ALKPHOS:5,BILITOT:5,PROT:5,ALBUMIN:5 in the last 168 hours No results found for this basename: LIPASE:5,AMYLASE:5 in the last 168 hours No results found for this basename: AMMONIA:5 in the last 168 hours CBC:  Lab 06/16/12 0640 06/15/12 2026  WBC 5.9 7.7  NEUTROABS -- --  HGB 12.6 13.5  HCT 37.8 39.7  MCV 95.9 95.4  PLT 406* 437*   Cardiac Enzymes: No results found for this basename: CKTOTAL:5,CKMB:5,CKMBINDEX:5,TROPONINI:5 in the last 168 hours BNP: BNP (last 3 results) No results found for this basename: PROBNP:3 in the last 8760 hours CBG: No results found for this basename: GLUCAP:5 in the last 168 hours  Time coordinating discharge: 35 minutes  Signed:  Marlin Canary  [facility] 06/17/2012, 1:03 PM

## 2012-06-17 NOTE — Progress Notes (Signed)
Bilateral:  No evidence of DVT, superficial thrombosis, or Baker's Cyst.   

## 2012-06-17 NOTE — Progress Notes (Signed)
Pt, significant other, and family members educated on follow up appointment, lovenox shot, and discharge material.  No questions or concerns.  Pt declines wheelchair and is discharged with significant other and family members. Ave Filter

## 2012-06-17 NOTE — Progress Notes (Signed)
  Echocardiogram 2D Echocardiogram has been performed.  Caitlin Hart 06/17/2012, 9:08 AM

## 2012-07-01 ENCOUNTER — Ambulatory Visit (INDEPENDENT_AMBULATORY_CARE_PROVIDER_SITE_OTHER): Payer: PRIVATE HEALTH INSURANCE | Admitting: General Surgery

## 2012-07-01 ENCOUNTER — Encounter (INDEPENDENT_AMBULATORY_CARE_PROVIDER_SITE_OTHER): Payer: Self-pay | Admitting: General Surgery

## 2012-07-01 VITALS — BP 108/66 | HR 64 | Temp 97.3°F | Resp 14 | Ht 63.5 in | Wt 142.2 lb

## 2012-07-01 DIAGNOSIS — Z09 Encounter for follow-up examination after completed treatment for conditions other than malignant neoplasm: Secondary | ICD-10-CM

## 2012-07-01 NOTE — Progress Notes (Signed)
Subjective:     Patient ID: Caitlin Hart, female   DOB: 03-04-1981, 31 y.o.   MRN: [id number]  HPI This is a 31 year old female with a history of Crohn's disease who I saw while I was on-call with appendicitis. I took her to the operating room and did a laparoscopic appendectomy what clearly was appendicitis. She had no real evidence of any Crohn's at the time of operation. She was discharged home doing well. Since then she has had a pulmonary embolus for which she is being treated for now. 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 She returns today doing well without any complaints.  Review of Systems     Objective:   Physical Exam Well-healed incisions without any infection    Assessment:     Status post laparoscopic appendectomy Appendiceal carcinoid tumor Status post pulmonary embolus    Plan:     She is doing fine from her appendectomy She can return to full activity. 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 The treatment for this is observation. We discussed as well. She will call me  back as needed.

## 2013-09-22 IMAGING — CT CT ANGIO CHEST
3 of 6 series · 15 of 36 positions shown · IV contrast ([ID] [id number])
Comparison: Plain film [DATE] 8189

***ADDENDUM*** CREATED: 06/15/2012 [DATE]

Findings conveyed in to Dr. Nepal on 06/15/2012 at [DATE]
***END ADDENDUM*** SIGNED BY: Adoles Peltrop, M.D.
CLINICAL DATA: Left pleural effusion.  Elevated D-dimer after
appendectomy.
CT ANGIOGRAPHY CHEST
TECHNIQUE: Multidetector CT imaging of the chest using the
standard protocol during bolus administration of intravenous
contrast. Multiplanar reconstructed images including MIPs were
obtained and reviewed to evaluate the vascular anatomy.
Contrast: 125mL OMNIPAQUE IOHEXOL 350 MG/ML SOLN

[Series 4: pe study (id) · axial · 0.70mm/px · z∈[-165,+8]mm · 4 of 117 slices shown]
[im 24/117  lung]
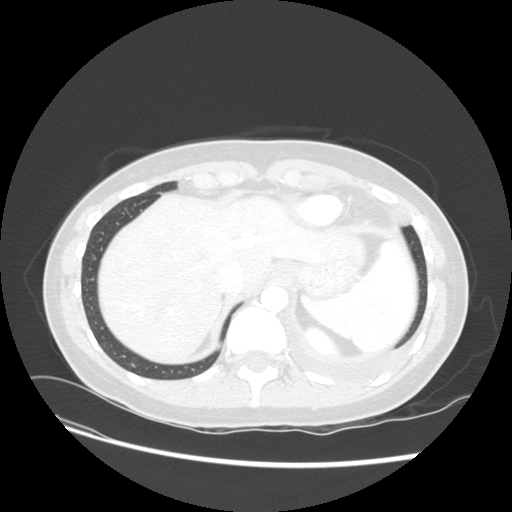
[im 47/117  lung]
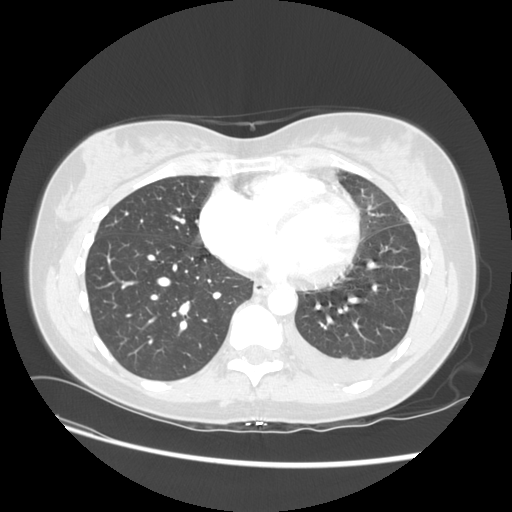
[im 70/117  lung]
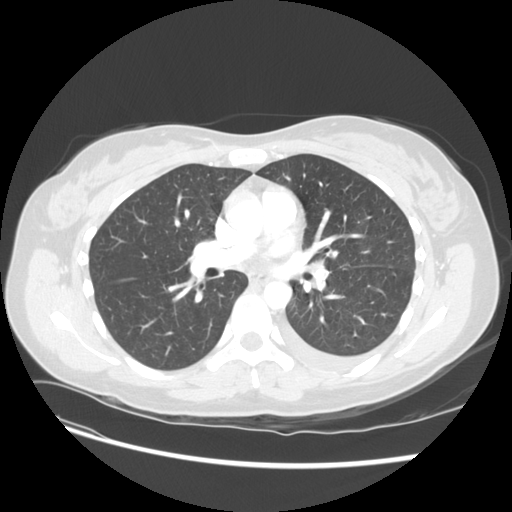
[im 93/117  lung]
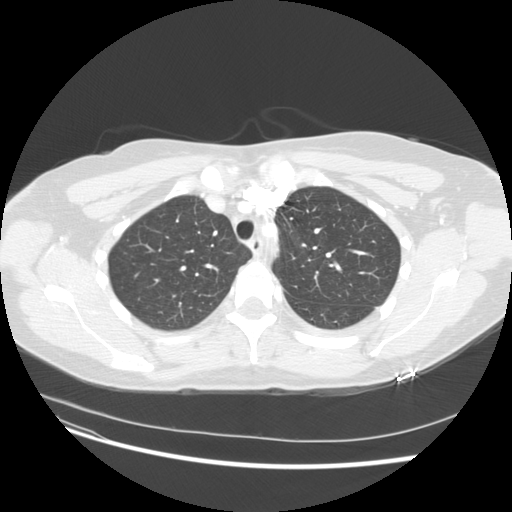

[Series 6: pe thin 1.25 · axial · 0.70mm/px · z∈[-191,+35]mm · 8 of 233 slices shown]
[im 26/233  lung]
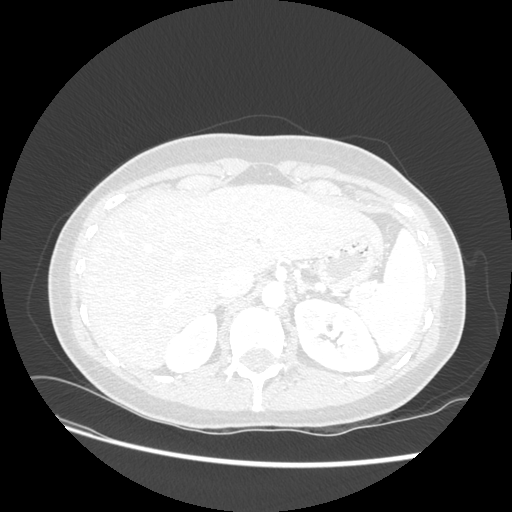
[im 52/233  mediastinal]
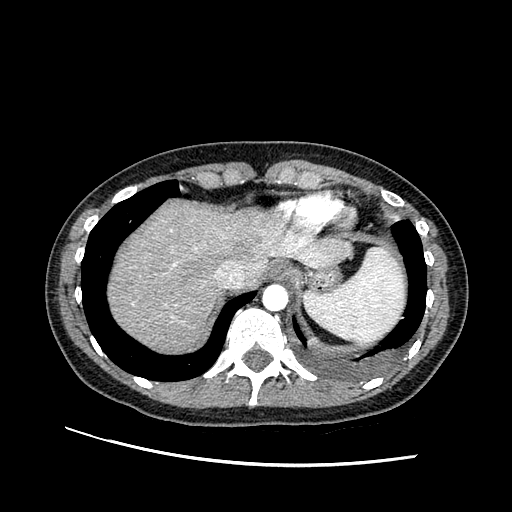
[im 78/233  lung]
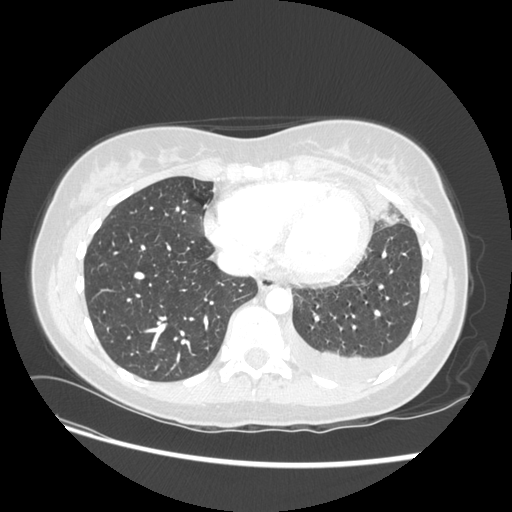
[im 104/233  mediastinal]
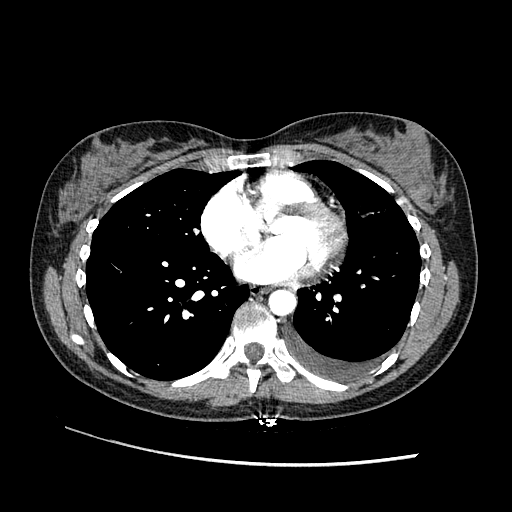
[im 129/233  lung]
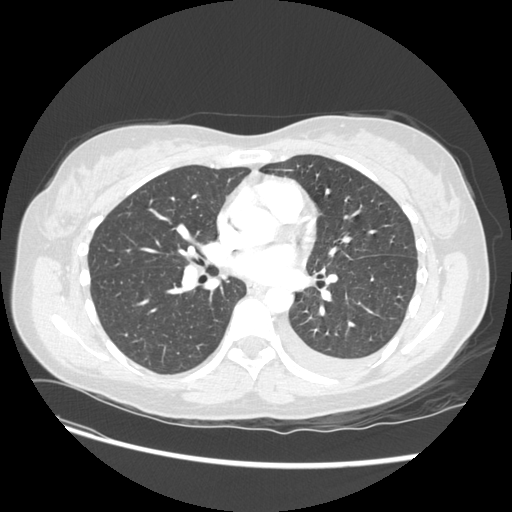
[im 155/233  mediastinal]
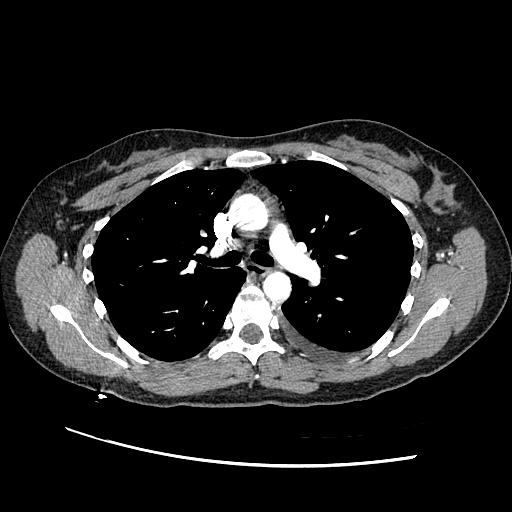
[im 181/233  lung]
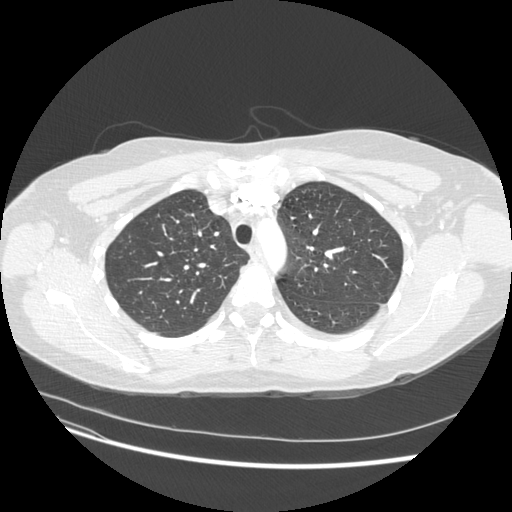
[im 207/233  mediastinal]
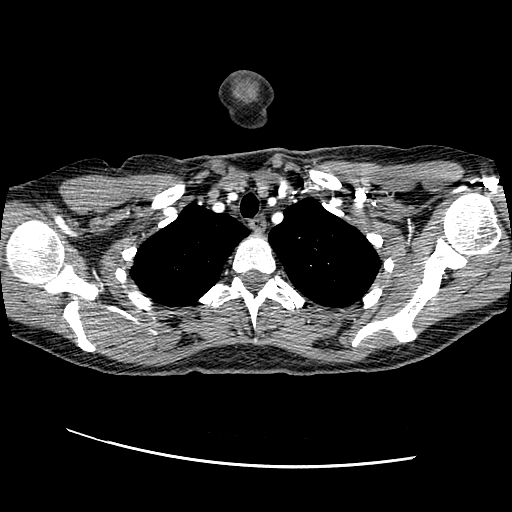

[Series 600: cor · coronal · 0.70mm/px · 3 of 95 slices shown]
[im 19/95  mediastinal]
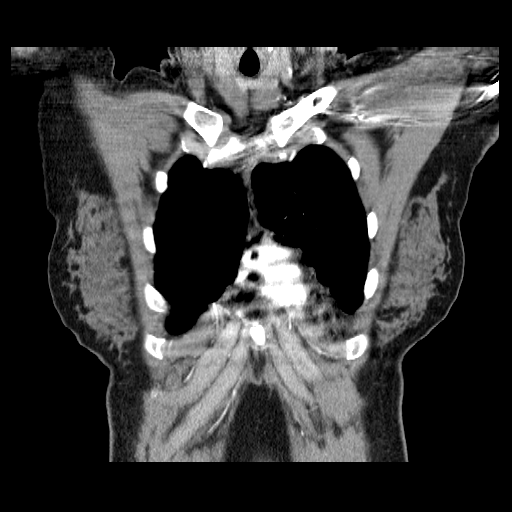
[im 38/95  mediastinal]
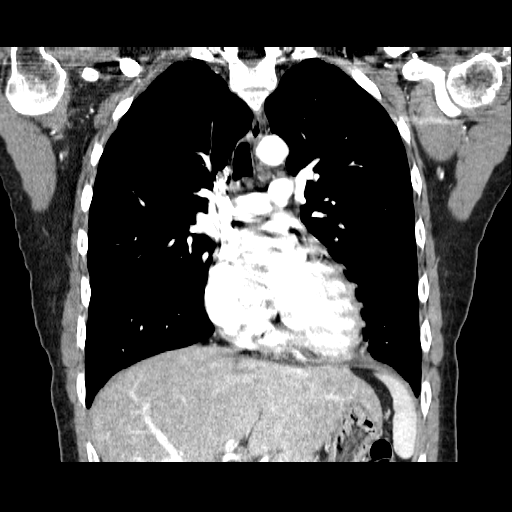
[im 57/95  mediastinal]
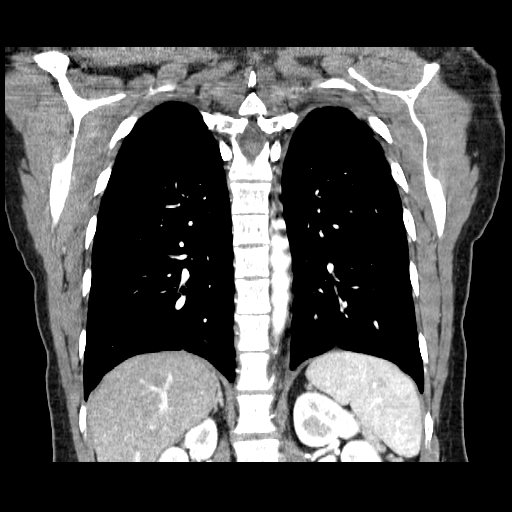

[15 of 36 positions shown; findings below may reference images not displayed]

FINDINGS: There is a tubular filling defect which extends into the
proximal branch of the lingula pulmonary artery as well as into the
proximal segmental branches of the left lower lobe (image 45).  No
evidence of emboli within the right lung

There is a small left pleural effusion.  A focus of atelectasis
contains central fat within the anterior medial lingula which may
represent infarction.

Heart appears normal.  Great vessels are normal.  No mediastinal or
hilar adenopathy.

Limited view upper abdomen is unremarkable..
IMPRESSION: 1.. Acute  pulmonary thromboembolism within the lingula and left
lower lobe.  Overall clot burden is minimal.
2.  New left pleural effusion.
3.  Potential  pulmonary infarction in the lingula.

A voice message was left on referring physicians on call service on
06/15/2012 at [DATE] p.m..

## 2015-11-05 ENCOUNTER — Other Ambulatory Visit: Payer: Self-pay | Admitting: Gastroenterology

## 2015-11-05 DIAGNOSIS — R1013 Epigastric pain: Secondary | ICD-10-CM

## 2015-11-20 ENCOUNTER — Other Ambulatory Visit (HOSPITAL_COMMUNITY): Payer: PRIVATE HEALTH INSURANCE

## 2015-11-20 ENCOUNTER — Ambulatory Visit (HOSPITAL_COMMUNITY): Payer: PRIVATE HEALTH INSURANCE

## 2015-11-25 ENCOUNTER — Ambulatory Visit (HOSPITAL_COMMUNITY): Payer: BC Managed Care – PPO

## 2015-11-25 ENCOUNTER — Ambulatory Visit (HOSPITAL_COMMUNITY)
Admission: RE | Admit: 2015-11-25 | Discharge: 2015-11-25 | Disposition: A | Discharge status: Home / Self Care | Payer: BC Managed Care – PPO | Source: Ambulatory Visit | Attending: Gastroenterology | Admitting: Gastroenterology

## 2015-11-25 DIAGNOSIS — R1013 Epigastric pain: Secondary | ICD-10-CM | POA: Diagnosis present

## 2015-11-25 MED ORDER — TECHNETIUM TC 99M MEBROFENIN IV KIT
5.2000 | PACK | Freq: Once | INTRAVENOUS | Status: AC | PRN
  Administered 2015-11-25: 5 via INTRAVENOUS

## 2015-11-25 MED ORDER — SINCALIDE 5 MCG IJ SOLR
INTRAMUSCULAR | Status: AC
  Administered 2015-11-25: 1.1 ug via INTRAVENOUS
  Filled 2015-11-25: qty 5

## 2015-11-25 MED ORDER — SINCALIDE 5 MCG IJ SOLR
0.0200 ug/kg | Freq: Once | INTRAMUSCULAR | Status: AC
  Administered 2015-11-25: 1.1 ug via INTRAVENOUS

## 2016-01-06 ENCOUNTER — Telehealth: Payer: Self-pay | Admitting: Obstetrics and Gynecology

## 2016-01-06 NOTE — Telephone Encounter (Signed)
Called and left a message for patient to call back to schedule a new patient doctor referral. °

## 2016-01-09 ENCOUNTER — Ambulatory Visit: Payer: BC Managed Care – PPO | Admitting: Obstetrics and Gynecology

## 2016-01-13 ENCOUNTER — Ambulatory Visit (INDEPENDENT_AMBULATORY_CARE_PROVIDER_SITE_OTHER): Payer: BC Managed Care – PPO | Admitting: Obstetrics and Gynecology

## 2016-01-13 ENCOUNTER — Encounter: Payer: Self-pay | Admitting: Obstetrics and Gynecology

## 2016-01-13 VITALS — BP 98/60 | HR 68 | Resp 13 | Ht 63.25 in | Wt 120.0 lb

## 2016-01-13 DIAGNOSIS — N946 Dysmenorrhea, unspecified: Secondary | ICD-10-CM

## 2016-01-13 DIAGNOSIS — R1032 Left lower quadrant pain: Secondary | ICD-10-CM | POA: Diagnosis not present

## 2016-01-13 DIAGNOSIS — Z86711 Personal history of pulmonary embolism: Secondary | ICD-10-CM

## 2016-01-13 DIAGNOSIS — K5900 Constipation, unspecified: Secondary | ICD-10-CM

## 2016-01-13 DIAGNOSIS — K5010 Crohn's disease of large intestine without complications: Secondary | ICD-10-CM | POA: Diagnosis not present

## 2016-01-13 NOTE — Patient Instructions (Signed)
Try Magnesium 250-500 mg a day for constipation.

## 2016-01-13 NOTE — Progress Notes (Signed)
Patient ID: Caitlin Hart, female   DOB: 08-Sep-1981, 35 y.o.   MRN: [id number] 35 y.o. [id number] SingleCaucasianF sent for a consultation from Dr Collene Mares for LLQ pain.  The patient c/o a 1.5 year h/o intermittent LLQ abdominal pain. Starts about a week prior to her cycle, occurs almost every month. The pain ends when her cycle starts. When she has the pain, it is fairly constant. Tends to get worse when she eats. Worse in the evenings. She also c/o worsening constipation the week prior to her cycle when she has the pain. The pain is a severe twisting pain, always in the same area. Ranges from a 2-90/10 in severity. She has a high pain tolerance. She does miss work with the pain. She has been taking tylenol with little relief of her pain. Dr Collene Mares just gave her a script for tramadol, she can't take NSAID's. Heat helps, local lavendar helps. Her pain ends with her cycle and then she gets cramps. Cramps vary, but are tolerable.  Sexually active, with female partner. No dyspareunia.  She has a h/o Crohns. In 2013 she had a PE a week post op from an appendectomy while on OCP's. She hasn't been diagnosed with a thrombophilia. Crohns is well controled in the last 2 years. She tends to be more constipated now, worse that week leading up to her cycle.   She was told she had small fibroids. She just had an ultrasound in 1/17, will get records sent here.   Period Cycle (Days): 28 Period Duration (Days): 6 days - spots in between cycles  Period Pattern: Regular Menstrual Flow: Light Menstrual Control: Maxi pad, Tampon Dysmenorrhea: (!) Severe Dysmenorrhea Symptoms: Cramping  She spots mid-cycle with ovulation.   Patient's last menstrual period was 01/11/2016.          Sexually active: Yes.   (Sexually active with a female) The current method of family planning is none.    Exercising: Yes.    walking and yogo Smoker:  no  Health Maintenance: Pap:  11/2015 WNl per patient History of abnormal Pap:  no MMG:   Never Colonoscopy:  12/2014 polyps  BMD:   Never TDaP:  2010 Gardasil: yes completed all 3    reports that she has never smoked. She has never used smokeless tobacco. She reports that she drinks about 1.2 - 1.8 oz of alcohol per week. She reports that she does not use illicit drugs.She is a Programmer, systems. Married to her female partner.   Past Medical History  Diagnosis Date  . Crohn's disease ([place])   . PCOS (polycystic ovarian syndrome)   . PONV (postoperative nausea and vomiting)   . Pulmonary embolism on left ([place]) 06/15/2012  . Asthma   . Crohn's colitis ([place])   . Anxiety   . Fibroid   . Clotting disorder ([place]) 06/2012    PE    Past Surgical History  Procedure Laterality Date  . Colonoscopy w/ biopsies  05/2010  . Tympanoplasty  1991    left  . Laparoscopic appendectomy  06/01/2012    Procedure: APPENDECTOMY LAPAROSCOPIC;  Surgeon: Rolm Bookbinder, MD;  Location: [facility];  Service: General;  Laterality: N/A;  . Nasal septum surgery  2009  . Wisdom tooth extraction  1999    "all 4"  . Appendectomy  06/01/12    Current Outpatient Prescriptions  Medication Sig Dispense Refill  . azathioprine (IMURAN) 100 MG tablet     . cetirizine (ZYRTEC) 10 MG tablet Take 10  mg by mouth daily as needed. For allergies.    . Probiotic Product (PROBIOTIC DAILY PO) Take by mouth.     No current facility-administered medications for this visit.    Family History  Problem Relation Age of Onset  . Diabetes Mother   . Hypertension Father   . Ovarian cancer Maternal Grandmother   . Cervical cancer Paternal Grandmother     Review of Systems  Constitutional: Positive for unexpected weight change.       Weight loss- 28lbs in the last year and a half  HENT: Negative.   Eyes: Negative.   Respiratory: Negative.   Cardiovascular: Negative.   Gastrointestinal: Negative.   Endocrine: Negative.   Genitourinary: Positive for pelvic pain.       LLQ pelvic pain   Musculoskeletal:  Negative.   Skin: Negative.   Allergic/Immunologic: Negative.   Neurological: Negative.   Psychiatric/Behavioral: Negative.     Exam:   BP 98/60 mmHg  Pulse 68  Resp 13  Ht 5' 3.25" (1.607 m)  Wt 120 lb (54.432 kg)  BMI 21.08 kg/m2  LMP 01/11/2016  Weight change: @WEIGHTCHANGE @ Height:   Height: 5' 3.25" (160.7 cm)  Ht Readings from Last 3 Encounters:  01/13/16 5' 3.25" (1.607 m)  07/01/12 5' 3.5" (1.613 m)  06/15/12 5' 3.5" (1.613 m)    General appearance: alert, cooperative and appears stated age Head: Normocephalic, without obvious abnormality, atraumatic Neck: no adenopathy, supple, symmetrical, trachea midline and thyroid normal to inspection and palpation Lungs: clear to auscultation bilaterally Heart: regular rate and rhythm Abdomen: soft, non-tender; bowel sounds normal; no masses,  no organomegaly. She points to the area of her mid descending colon as to the location of her pain.  Extremities: extremities normal, atraumatic, no cyanosis or edema Skin: Skin color, texture, turgor normal. No rashes or lesions Lymph nodes: Cervical, supraclavicular, and axillary nodes normal. No abnormal inguinal nodes palpated Neurologic: Grossly normal   Pelvic: External genitalia:  no lesions              Urethra:  normal appearing urethra with no masses, tenderness or lesions              Bartholins and Skenes: normal                 Vagina: normal appearing vagina with normal color and discharge, no lesions              Cervix: no lesions               Bimanual Exam:  Uterus:  normal size, contour, position, consistency, mobility, non-tender and anteverted              Adnexa: no mass, fullness, tenderness               Rectovaginal: declines, currently dealing with a fissure.  Pelvic floor: not tender  Chaperone was present for exam.  A:  The patient has cyclic LLQ abdominal pain the week prior to her cycle, occurs almost every month. She does report increased constipation  that week and worsening pain when eating. The pain ends when her cycle starts. She does have menstrual cramps, but they are tolerable for her.  Given the timing of the pain I don't think it is caused by endometriosis (although with her menstrual cramps, she could have endometriosis). Clearly there is a hormonal etiology of her pain, given the timing. The hormonal changes seem associated with the constipation. I reviewed with the  patient that it is not uncommon for women to have bowel changes the week prior, or week of their cycle.    P:   The patient has a h/o a PE, unfortunately that makes OCP's contraindicated. We discussed lupron, but that isn't a long term option. We discussed laparoscopy, with treatment of any possible endometriosis (would be more inclined in that direction if her pain was with her menses), possible LOA. She would like to avoid surgery. Although her pain is clearly affected by her hormones, I wouldn't recommend oophorectomy at her age (risks of heart disease, bone loss).  I recommended she try and get her constipation under better control. She states she knows she isn't drinking enough water. Recommended she try magnesium supplementation 250-500 mg a day. Discussed increasing the fruit in her diet.   Return if she would like to try one of the options discussed.   Will get a copy of her u/s records   CC: Dr Collene Mares Letter sent  Dr Lorie Apley records received and reviewed.

## 2016-01-14 ENCOUNTER — Telehealth: Payer: Self-pay | Admitting: Obstetrics and Gynecology

## 2016-01-14 NOTE — Telephone Encounter (Signed)
Records of labs reviewed and last pap. Pap was done in 10/16 and was negative.

## 2016-02-08 ENCOUNTER — Telehealth: Payer: Self-pay | Admitting: Obstetrics and Gynecology

## 2016-02-08 NOTE — Telephone Encounter (Signed)
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
